# Patient Record
Sex: Female | Born: 1960 | Race: White | Hispanic: No | Marital: Single | Smoking: Current every day smoker
Health system: Southern US, Community
[De-identification: ages and names within clinical notes are randomized; demographics above are authoritative.]

## PROBLEM LIST (undated history)

## (undated) DIAGNOSIS — M549 Dorsalgia, unspecified: Secondary | ICD-10-CM

## (undated) DIAGNOSIS — F32A Depression, unspecified: Secondary | ICD-10-CM

## (undated) DIAGNOSIS — F329 Major depressive disorder, single episode, unspecified: Secondary | ICD-10-CM

## (undated) DIAGNOSIS — B1920 Unspecified viral hepatitis C without hepatic coma: Secondary | ICD-10-CM

## (undated) DIAGNOSIS — G8929 Other chronic pain: Secondary | ICD-10-CM

## (undated) DIAGNOSIS — F419 Anxiety disorder, unspecified: Secondary | ICD-10-CM

## (undated) DIAGNOSIS — M5430 Sciatica, unspecified side: Secondary | ICD-10-CM

## (undated) DIAGNOSIS — A0472 Enterocolitis due to Clostridium difficile, not specified as recurrent: Secondary | ICD-10-CM

## (undated) DIAGNOSIS — F1120 Opioid dependence, uncomplicated: Secondary | ICD-10-CM

## (undated) HISTORY — PX: CHOLECYSTECTOMY: SHX55

## (undated) HISTORY — PX: ABDOMINAL HYSTERECTOMY: SHX81

## (undated) HISTORY — PX: APPENDECTOMY: SHX54

## (undated) HISTORY — PX: LEFT OOPHORECTOMY: SHX1961

---

## 2005-10-12 ENCOUNTER — Emergency Department (HOSPITAL_COMMUNITY): Admission: EM | Admit: 2005-10-12 | Discharge: 2005-10-12 | Payer: Self-pay | Admitting: Emergency Medicine

## 2005-10-30 ENCOUNTER — Emergency Department (HOSPITAL_COMMUNITY): Admission: EM | Admit: 2005-10-30 | Discharge: 2005-10-30 | Payer: Self-pay | Admitting: Emergency Medicine

## 2005-12-05 ENCOUNTER — Emergency Department (HOSPITAL_COMMUNITY): Admission: EM | Admit: 2005-12-05 | Discharge: 2005-12-06 | Payer: Self-pay | Admitting: Emergency Medicine

## 2005-12-09 ENCOUNTER — Emergency Department (HOSPITAL_COMMUNITY): Admission: EM | Admit: 2005-12-09 | Discharge: 2005-12-09 | Payer: Self-pay | Admitting: Emergency Medicine

## 2005-12-12 ENCOUNTER — Emergency Department (HOSPITAL_COMMUNITY): Admission: EM | Admit: 2005-12-12 | Discharge: 2005-12-12 | Payer: Self-pay | Admitting: Emergency Medicine

## 2009-12-22 ENCOUNTER — Emergency Department (HOSPITAL_COMMUNITY): Admission: EM | Admit: 2009-12-22 | Discharge: 2009-12-22 | Payer: Self-pay | Admitting: Emergency Medicine

## 2011-02-07 ENCOUNTER — Inpatient Hospital Stay: Payer: Self-pay | Admitting: Psychiatry

## 2012-02-18 ENCOUNTER — Encounter (HOSPITAL_COMMUNITY): Payer: Self-pay | Admitting: *Deleted

## 2012-02-18 ENCOUNTER — Emergency Department (HOSPITAL_COMMUNITY)
Admission: EM | Admit: 2012-02-18 | Discharge: 2012-02-18 | Disposition: A | Discharge status: Home / Self Care | Payer: Self-pay | Attending: Emergency Medicine | Admitting: Emergency Medicine

## 2012-02-18 DIAGNOSIS — K0889 Other specified disorders of teeth and supporting structures: Secondary | ICD-10-CM

## 2012-02-18 DIAGNOSIS — F17200 Nicotine dependence, unspecified, uncomplicated: Secondary | ICD-10-CM | POA: Insufficient documentation

## 2012-02-18 DIAGNOSIS — K029 Dental caries, unspecified: Secondary | ICD-10-CM | POA: Insufficient documentation

## 2012-02-18 MED ORDER — HYDROCODONE-ACETAMINOPHEN 5-325 MG PO TABS: ORAL_TABLET | ORAL | Status: DC

## 2012-02-18 MED ORDER — NAPROXEN 250 MG PO TABS: 250.0000 mg | ORAL_TABLET | Freq: Two times a day (BID) | ORAL | Status: DC

## 2012-02-18 MED ORDER — OXYCODONE-ACETAMINOPHEN 5-325 MG PO TABS
1.0000 | ORAL_TABLET | Freq: Once | ORAL | Status: AC
  Administered 2012-02-18: 1 via ORAL
  Filled 2012-02-18: qty 1

## 2012-02-18 MED ORDER — PENICILLIN V POTASSIUM 250 MG PO TABS: 250.0000 mg | ORAL_TABLET | Freq: Four times a day (QID) | ORAL | Status: DC

## 2012-02-18 NOTE — ED Notes (Signed)
Pt states left upper molar pain x 3 days

## 2012-02-18 NOTE — ED Provider Notes (Signed)
History     CSN: [id number]  Arrival date & time 02/18/12  1302   First MD Initiated Contact with Patient 02/18/12 1310      Chief Complaint  Patient presents with  . Dental Pain     HPI Pt was seen at 1320.  Per pt, c/o gradual onset and persistence of constant left lower tooth "pain" for the past 3 days.  Denies fevers, no intra-oral edema, no rash, no facial swelling, no dysphagia, no neck pain.   The condition is aggravated by nothing. The condition is relieved by nothing. The symptoms have been associated with no other complaints. The patient has no significant history of serious medical conditions.    History reviewed. No pertinent past medical history.  Past Surgical History  Procedure Date  . Abdominal hysterectomy   . Appendectomy   . Left oophorectomy      History  Substance Use Topics  . Smoking status: Current Everyday Smoker    Types: Cigarettes  . Smokeless tobacco: Not on file  . Alcohol Use: No      Review of Systems ROS: Statement: All systems negative except as marked or noted in the HPI; Constitutional: Negative for fever and chills. ; ; Eyes: Negative for eye pain and discharge. ; ; ENMT: Positive for dental caries, dental hygiene poor and toothache. Negative for ear pain, bleeding gums, dental injury, facial deformity, facial swelling, hoarseness, nasal congestion, sinus pressure, sore throat, throat swelling and tongue swollen. ; ; Cardiovascular: Negative for chest pain, palpitations, diaphoresis, dyspnea and peripheral edema. ; ; Respiratory: Negative for cough, wheezing and stridor. ; ; Gastrointestinal: Negative for nausea, vomiting, diarrhea and abdominal pain. ; ; Genitourinary: Negative for dysuria, flank pain and hematuria. ; ; Musculoskeletal: Negative for back pain and neck pain. ; ; Skin: Negative for rash and skin lesion. ; ; Neuro: Negative for headache, lightheadedness and neck stiffness. ;     Allergies  Nubain  Home Medications    Current Outpatient Rx  Name Route Sig Dispense Refill  . IBUPROFEN 800 MG PO TABS Oral Take 800 mg by mouth every 8 (eight) hours as needed. For pain and swelling      BP 131/82  Pulse 78  Temp 97.5 F (36.4 C) (Oral)  Resp 20  Ht 5\' 8"  (1.727 m)  Wt 160 lb (72.576 kg)  BMI 24.33 kg/m2  SpO2 99%  Physical Exam 1325: Physical examination: Vital signs and O2 SAT: Reviewed; Constitutional: Well developed, Well nourished, Well hydrated, In no acute distress; Head and Face: Normocephalic, Atraumatic; Eyes: EOMI, PERRL, No scleral icterus; ENMT: Mouth and pharynx normal, Poor dentition, Widespread dental decay, Left TM normal, Right TM normal, Mucous membranes moist, +lower left 1st molar with extensive dental decay.  No gingival erythema, edema, fluctuance, or drainage.  No hoarse voice, no drooling, no stridor.  ; Neck: Supple, Full range of motion, No lymphadenopathy; Cardiovascular: Regular rate and rhythm, No murmur, rub, or gallop; Respiratory: Breath sounds clear & equal bilaterally, No rales, rhonchi, wheezes, or rub, Normal respiratory effort/excursion; Chest: Nontender, Movement normal; Extremities: Pulses normal, No tenderness, No edema; Neuro: AA&Ox3, Major CN grossly intact.  No gross focal motor or sensory deficits in extremities.; Skin: Color normal, No rash, No petechiae, Warm, Dry   ED Course  Procedures    MDM  MDM Reviewed: nursing note and vitals      1330:  Pt encouraged to f/u with dentist or oral surgeon for her dental needs for good continuity  of care and definitive treatment.  Verb understanding.        Laray Anger, DO 02/18/12 [phone]

## 2012-02-18 NOTE — ED Notes (Signed)
Pt c/o left upper tooth pain x 3 days.

## 2012-02-25 ENCOUNTER — Encounter (HOSPITAL_COMMUNITY): Payer: Self-pay

## 2012-02-25 ENCOUNTER — Emergency Department (HOSPITAL_COMMUNITY)
Admission: EM | Admit: 2012-02-25 | Discharge: 2012-02-25 | Disposition: A | Discharge status: Home / Self Care | Payer: Self-pay | Attending: Emergency Medicine | Admitting: Emergency Medicine

## 2012-02-25 DIAGNOSIS — K0889 Other specified disorders of teeth and supporting structures: Secondary | ICD-10-CM

## 2012-02-25 DIAGNOSIS — Z886 Allergy status to analgesic agent status: Secondary | ICD-10-CM | POA: Insufficient documentation

## 2012-02-25 DIAGNOSIS — K089 Disorder of teeth and supporting structures, unspecified: Secondary | ICD-10-CM | POA: Insufficient documentation

## 2012-02-25 DIAGNOSIS — F17200 Nicotine dependence, unspecified, uncomplicated: Secondary | ICD-10-CM | POA: Insufficient documentation

## 2012-02-25 MED ORDER — PENICILLIN V POTASSIUM 500 MG PO TABS: 500.0000 mg | ORAL_TABLET | Freq: Four times a day (QID) | ORAL | Status: AC

## 2012-02-25 MED ORDER — OXYCODONE-ACETAMINOPHEN 5-325 MG PO TABS
1.0000 | ORAL_TABLET | Freq: Once | ORAL | Status: AC
  Administered 2012-02-25: 1 via ORAL
  Filled 2012-02-25: qty 1

## 2012-02-25 MED ORDER — OXYCODONE-ACETAMINOPHEN 5-325 MG PO TABS: ORAL_TABLET | ORAL | Status: DC

## 2012-02-25 MED ORDER — PENICILLIN V POTASSIUM 250 MG PO TABS
500.0000 mg | ORAL_TABLET | Freq: Once | ORAL | Status: AC
  Administered 2012-02-25: 500 mg via ORAL
  Filled 2012-02-25: qty 2

## 2012-02-25 NOTE — ED Provider Notes (Signed)
History     CSN: [id number]  Arrival date & time 02/25/12  1208   First MD Initiated Contact with Patient 02/25/12 1236      Chief Complaint  Patient presents with  . Dental Pain    (Consider location/radiation/quality/duration/timing/severity/associated sxs/prior treatment) HPI Comments: Here last week with same complaint.  Out of penicillin and hydrocodone.  Has dental appt in 3 days.  Patient is a 51 y.o. female presenting with tooth pain. The history is provided by the patient. No language interpreter was used.  Dental PainThe primary symptoms include mouth pain. Primary symptoms do not include fever. Episode onset: ~ 10 days ago. The symptoms are unchanged. The symptoms occur constantly.  Additional symptoms do not include: purulent gums.    History reviewed. No pertinent past medical history.  Past Surgical History  Procedure Date  . Abdominal hysterectomy   . Appendectomy   . Left oophorectomy     No family history on file.  History  Substance Use Topics  . Smoking status: Current Everyday Smoker    Types: Cigarettes  . Smokeless tobacco: Not on file  . Alcohol Use: No    OB History    Grav Para Term Preterm Abortions TAB SAB Ect Mult Living                  Review of Systems  Constitutional: Negative for fever.  HENT: Positive for dental problem.   All other systems reviewed and are negative.    Allergies  Nubain  Home Medications   Current Outpatient Rx  Name Route Sig Dispense Refill  . IBUPROFEN 800 MG PO TABS Oral Take 800 mg by mouth every 8 (eight) hours as needed. For pain and swelling    . OXYCODONE-ACETAMINOPHEN 5-325 MG PO TABS  One tab po q 4-6 hrs prn pain 20 tablet 0  . PENICILLIN V POTASSIUM 500 MG PO TABS Oral Take 1 tablet (500 mg total) by mouth 4 (four) times daily. 40 tablet 0    BP 143/83  Pulse 72  Temp 98.2 F (36.8 C) (Oral)  Resp 20  SpO2 100%  Physical Exam  Nursing note and vitals reviewed. Constitutional:  She is oriented to person, place, and time. She appears well-developed and well-nourished. No distress.  HENT:  Head: Normocephalic and atraumatic.  Mouth/Throat: Uvula is midline, oropharynx is clear and moist and mucous membranes are normal. Dental caries present. No uvula swelling.    Eyes: EOM are normal.  Neck: Normal range of motion.  Cardiovascular: Normal rate, regular rhythm and normal heart sounds.   Pulmonary/Chest: Effort normal and breath sounds normal.  Abdominal: Soft. She exhibits no distension. There is no tenderness.  Musculoskeletal: Normal range of motion.  Neurological: She is alert and oriented to person, place, and time.  Skin: Skin is warm and dry.  Psychiatric: She has a normal mood and affect. Judgment normal.    ED Course  Procedures (including critical care time)  Labs Reviewed - No data to display No results found.   1. Pain, dental       MDM  Pen vk 500 mg, 40 rx-percocet, 20 F/u with dentist in 3 days as planned.        Evalina Field, [place] 02/25/12 1322

## 2012-02-25 NOTE — ED Notes (Signed)
Left side toothache for 1 week, has been seen in er for same and thinks she needs more antibiotics

## 2012-02-25 NOTE — ED Provider Notes (Signed)
Medical screening examination/treatment/procedure(s) were performed by non-physician practitioner and as supervising physician I was immediately available for consultation/collaboration.  Maribeth Jiles M Jendayi Berling, MD 02/25/12 1903 

## 2012-02-25 NOTE — ED Notes (Signed)
Pt presents with upper left tooth pain, was treated here 1 week prior. Treated with pcn and pain medication. Pt states pain continues and pain is increasing. Pt has small amount upper left swelling noted into bridge of nose. Pt states has an appointment with a dental clinic in [place] next Tuesday.

## 2012-03-18 ENCOUNTER — Encounter (HOSPITAL_COMMUNITY): Payer: Self-pay | Admitting: Emergency Medicine

## 2012-03-18 ENCOUNTER — Emergency Department (HOSPITAL_COMMUNITY)
Admission: EM | Admit: 2012-03-18 | Discharge: 2012-03-18 | Disposition: A | Discharge status: Home / Self Care | Payer: Self-pay | Attending: Emergency Medicine | Admitting: Emergency Medicine

## 2012-03-18 DIAGNOSIS — K089 Disorder of teeth and supporting structures, unspecified: Secondary | ICD-10-CM | POA: Insufficient documentation

## 2012-03-18 DIAGNOSIS — Z888 Allergy status to other drugs, medicaments and biological substances status: Secondary | ICD-10-CM | POA: Insufficient documentation

## 2012-03-18 DIAGNOSIS — K0889 Other specified disorders of teeth and supporting structures: Secondary | ICD-10-CM

## 2012-03-18 DIAGNOSIS — F17200 Nicotine dependence, unspecified, uncomplicated: Secondary | ICD-10-CM | POA: Insufficient documentation

## 2012-03-18 MED ORDER — PENICILLIN V POTASSIUM 500 MG PO TABS: 250.0000 mg | ORAL_TABLET | Freq: Four times a day (QID) | ORAL | Status: AC

## 2012-03-18 MED ORDER — IBUPROFEN 400 MG PO TABS
400.0000 mg | ORAL_TABLET | Freq: Once | ORAL | Status: AC
  Administered 2012-03-18: 800 mg via ORAL

## 2012-03-18 MED ORDER — HYDROCODONE-ACETAMINOPHEN 5-325 MG PO TABS: 1.0000 | ORAL_TABLET | ORAL | Status: AC | PRN

## 2012-03-18 MED ORDER — IBUPROFEN 800 MG PO TABS
ORAL_TABLET | ORAL | Status: AC
  Administered 2012-03-18: 800 mg via ORAL
  Filled 2012-03-18: qty 1

## 2012-03-18 NOTE — ED Provider Notes (Signed)
History     CSN: [id number]  Arrival date & time 03/18/12  1513   First MD Initiated Contact with Patient 03/18/12 1652      Chief Complaint  Patient presents with  . Dental Pain    HPI Sydney [name] is a 51 y.o. female who presents to the ED with dental pain. She had an extraction 6 days ago in the left upper. Pain started yesterday. Patient not sure if the pain is from the extraction or from the tooth next to it. She rates the pain as 8/10. The pain throbbing constant pain. She took antibiotic before the surgery but none since then. Taking ibuprofen 800 mg without relief. The history was provided by the patient.   History reviewed. No pertinent past medical history.  Past Surgical History  Procedure Date  . Abdominal hysterectomy   . Appendectomy   . Left oophorectomy     History reviewed. No pertinent family history.  History  Substance Use Topics  . Smoking status: Current Every Day Smoker    Types: Cigarettes  . Smokeless tobacco: Not on file  . Alcohol Use: No    OB History    Grav Para Term Preterm Abortions TAB SAB Ect Mult Living                  Review of Systems  Constitutional: Negative for fever, chills and activity change.  HENT: Positive for dental problem. Negative for facial swelling.   Eyes: Negative.   Respiratory: Negative.   Cardiovascular: Negative.   Gastrointestinal: Negative for nausea, vomiting, abdominal pain and diarrhea.  Skin: Negative.   Neurological: Negative for headaches.  Psychiatric/Behavioral: Negative for confusion and agitation.    Allergies  Nubain  Home Medications   Current Outpatient Rx  Name Route Sig Dispense Refill  . IBUPROFEN 800 MG PO TABS Oral Take 800 mg by mouth every 8 (eight) hours as needed. For pain and swelling    . OXYCODONE-ACETAMINOPHEN 5-325 MG PO TABS  One tab po q 4-6 hrs prn pain 20 tablet 0    BP 136/77  Pulse 96  Temp 98.1 F (36.7 C) (Oral)  Ht 5\' 8"  (1.727 m)  Wt 160 lb (72.576 kg)   BMI 24.33 kg/m2  SpO2 97%  Physical Exam  Nursing note and vitals reviewed. Constitutional: She is oriented to person, place, and time. She appears well-developed and well-nourished. No distress.       Uncomfortable appearing'  HENT:  Head: Normocephalic and atraumatic.  Mouth/Throat: Uvula is midline, oropharynx is clear and moist and mucous membranes are normal. Dental caries present. No oropharyngeal exudate.         The pain is not at the area of the extraction but the tooth next to the extraction.  Eyes: EOM are normal. Pupils are equal, round, and reactive to light.  Neck: Normal range of motion. Neck supple.  Cardiovascular: Normal rate.   Pulmonary/Chest: Effort normal.  Musculoskeletal: Normal range of motion. She exhibits no edema.  Lymphadenopathy:    She has no cervical adenopathy.  Neurological: She is alert and oriented to person, place, and time. No cranial nerve deficit.  Skin: Skin is warm and dry.  Psychiatric: She has a normal mood and affect. Her behavior is normal. Judgment and thought content normal.   Assessment: 51 y.o. female with dental pain    Dental decay  Plan:  Pain management   Antibiotics   Return to dental clinic Discussed with the patient and  all questioned fully answered. She will follow up with her dentist or return here if any problems arise.   Medication List     As of 03/18/2012  5:08 PM    START taking these medications         HYDROcodone-acetaminophen 5-325 MG per tablet   Commonly known as: NORCO/VICODIN   Take 1 tablet by mouth every 4 (four) hours as needed for pain.      penicillin v potassium 500 MG tablet   Commonly known as: VEETID   Take 0.5 tablets (250 mg total) by mouth 4 (four) times daily.      ASK your doctor about these medications         ibuprofen 800 MG tablet   Commonly known as: ADVIL,MOTRIN      oxyCODONE-acetaminophen 5-325 MG per tablet   Commonly known as: PERCOCET/ROXICET   One tab po q 4-6 hrs  prn pain          Where to get your medications    These are the prescriptions that you need to pick up.   You may get these medications from any pharmacy.         HYDROcodone-acetaminophen 5-325 MG per tablet   penicillin v potassium 500 MG tablet           ED Course  Procedures      [facility], NP 03/18/12 1708

## 2012-03-18 NOTE — ED Provider Notes (Signed)
Medical screening examination/treatment/procedure(s) were performed by non-physician practitioner and as supervising physician I was immediately available for consultation/collaboration. Devoria Albe, MD, Armando Gang   Ward Givens, MD 03/18/12 [phone]

## 2012-03-18 NOTE — ED Notes (Addendum)
Pt had tooth extraction x 5 days ago. States a lot of pain since last night. Nad. No facial swelling noted. Out of rx pain meds. Still taking ibuprofen-last dose 830

## 2012-06-19 ENCOUNTER — Emergency Department (HOSPITAL_COMMUNITY): Payer: Self-pay

## 2012-06-19 ENCOUNTER — Emergency Department (HOSPITAL_COMMUNITY)
Admission: EM | Admit: 2012-06-19 | Discharge: 2012-06-20 | Disposition: A | Discharge status: Other Acute Inpatient Hospital | Payer: Self-pay | Attending: Emergency Medicine | Admitting: Emergency Medicine

## 2012-06-19 ENCOUNTER — Encounter (HOSPITAL_COMMUNITY): Payer: Self-pay

## 2012-06-19 DIAGNOSIS — M545 Low back pain, unspecified: Secondary | ICD-10-CM | POA: Insufficient documentation

## 2012-06-19 DIAGNOSIS — F329 Major depressive disorder, single episode, unspecified: Secondary | ICD-10-CM | POA: Insufficient documentation

## 2012-06-19 DIAGNOSIS — M25569 Pain in unspecified knee: Secondary | ICD-10-CM | POA: Insufficient documentation

## 2012-06-19 DIAGNOSIS — Z79899 Other long term (current) drug therapy: Secondary | ICD-10-CM | POA: Insufficient documentation

## 2012-06-19 DIAGNOSIS — F1910 Other psychoactive substance abuse, uncomplicated: Secondary | ICD-10-CM

## 2012-06-19 DIAGNOSIS — F3289 Other specified depressive episodes: Secondary | ICD-10-CM | POA: Insufficient documentation

## 2012-06-19 DIAGNOSIS — F32A Depression, unspecified: Secondary | ICD-10-CM

## 2012-06-19 DIAGNOSIS — F39 Unspecified mood [affective] disorder: Secondary | ICD-10-CM | POA: Insufficient documentation

## 2012-06-19 DIAGNOSIS — R1032 Left lower quadrant pain: Secondary | ICD-10-CM | POA: Insufficient documentation

## 2012-06-19 DIAGNOSIS — F17200 Nicotine dependence, unspecified, uncomplicated: Secondary | ICD-10-CM | POA: Insufficient documentation

## 2012-06-19 HISTORY — DX: Depression, unspecified: F32.A

## 2012-06-19 HISTORY — DX: Major depressive disorder, single episode, unspecified: F32.9

## 2012-06-19 LAB — BASIC METABOLIC PANEL
Calcium: 9.9 mg/dL (ref 8.4–10.5)
Creatinine, Ser: 0.58 mg/dL (ref 0.50–1.10)
GFR calc Af Amer: >90 mL/min (ref >90)
Sodium: 139 mEq/L (ref 135–145)

## 2012-06-19 LAB — CBC WITH DIFFERENTIAL/PLATELET
Basophils Absolute: 0 10*3/uL (ref 0.0–0.1)
Eosinophils Absolute: 0.2 10*3/uL (ref 0.0–0.7)
Eosinophils Relative: 2 % (ref 0–5)
HCT: 47.4 % — ABNORMAL HIGH (ref 36.0–46.0)
Hemoglobin: 16.5 g/dL — ABNORMAL HIGH (ref 12.0–15.0)
Lymphs Abs: 2.6 10*3/uL (ref 0.7–4.0)
MCH: 31.8 pg (ref 26.0–34.0)
MCV: 91.3 fL (ref 78.0–100.0)
Platelets: 325 10*3/uL (ref 150–400)
RBC: 5.19 MIL/uL — ABNORMAL HIGH (ref 3.87–5.11)
RDW: 12.9 % (ref 11.5–15.5)

## 2012-06-19 LAB — ACETAMINOPHEN LEVEL: Acetaminophen (Tylenol), Serum: <15 ug/mL (ref 10–30)

## 2012-06-19 LAB — RAPID URINE DRUG SCREEN, HOSP PERFORMED
Amphetamines: NOT DETECTED
Barbiturates: NOT DETECTED
Tetrahydrocannabinol: POSITIVE — AB

## 2012-06-19 LAB — URINALYSIS, ROUTINE W REFLEX MICROSCOPIC
Glucose, UA: NEGATIVE mg/dL
Nitrite: NEGATIVE
Specific Gravity, Urine: >1.03 — ABNORMAL HIGH (ref 1.005–1.030)

## 2012-06-19 LAB — URINE MICROSCOPIC-ADD ON

## 2012-06-19 LAB — ETHANOL: Alcohol, Ethyl (B): <11 mg/dL (ref 0–11)

## 2012-06-19 MED ORDER — DULOXETINE HCL 30 MG PO CPEP
ORAL_CAPSULE | ORAL | Status: AC
  Filled 2012-06-19: qty 1

## 2012-06-19 MED ORDER — DULOXETINE HCL 30 MG PO CPEP
30.0000 mg | ORAL_CAPSULE | Freq: Every day | ORAL | Status: DC
  Administered 2012-06-19: 30 mg via ORAL
  Filled 2012-06-19 (×3): qty 1

## 2012-06-19 MED ORDER — OXYCODONE-ACETAMINOPHEN 5-325 MG PO TABS
1.0000 | ORAL_TABLET | Freq: Once | ORAL | Status: AC
  Administered 2012-06-19: 1 via ORAL
  Filled 2012-06-19: qty 1

## 2012-06-19 NOTE — ED Notes (Signed)
Pt states she has severe depression. Has been off her medication for a while and cannot afford them

## 2012-06-19 NOTE — ED Notes (Signed)
Telepsych complete

## 2012-06-19 NOTE — ED Notes (Signed)
Pt has been accepted to behavioral health, Charleen Kirks PA for Dr. Daleen Bo accepting. Room 501 bed 2.

## 2012-06-19 NOTE — ED Notes (Signed)
Transport called, [facility] does not have a truck available at this time.

## 2012-06-19 NOTE — BH Assessment (Signed)
Assessment Note   Sydney [name] is an 51 y.o. female. The patient came to the ED today because she has become increasingly depressed since running out of [place] about a month ago. 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 She has also not been able to afford her medications. She was followed in a pain management clinic in [place]. She is now in [place] and is apply for SSDI and Medicaid. She is feeling hopeless and helpless. She is not sleeping well. She has lost interest in pleasurable activities. She is having suicidal thoughts with a plant to cut herself with a box  Cutter. She cannot contract for safety. She is not homicidal and has no history ov violence. She is not psychotic. Discussed with Dr Laurice Record and feels she is in need of inpatient treatment . Patient referred to [facility] and to [facility].  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 Depression    Axis IV: economic problems, occupational problems, other psychosocial or environmental problems, problems related to social environment and problems with access to health care services Axis V: 31-40 impairment in reality testing  Past Medical History:  Past Medical History  Diagnosis Date  . Depression     Past Surgical History  Procedure Date  . Abdominal hysterectomy   . Appendectomy   . Left oophorectomy     Family History: No family history on file.  Social History:  reports that she has been smoking Cigarettes.  She does not have any smokeless tobacco history on file. She reports that she does not drink alcohol or use illicit drugs.  Additional Social History:     CIWA: CIWA-Ar BP: 118/89 mmHg Pulse Rate: 96  COWS:    Allergies:  Allergies  Allergen Reactions  . Nubain (Nalbuphine Hcl) Swelling    Home Medications:  (Not in a hospital admission)  OB/GYN Status:  No LMP recorded.  Patient has had a hysterectomy.  General Assessment Data Location of Assessment: [facility] ACT Assessment: Yes Living Arrangements: Non-relatives/Friends (roommate) Can pt return to current living arrangement?: Yes Admission Status: Voluntary Is patient capable of signing voluntary admission?: Yes Transfer from: Acute Hospital Referral Source: MD  Education Status Is patient currently in school?: No  Risk to self Suicidal Ideation: Yes-Currently Present Suicidal Intent: Yes-Currently Present Is patient at risk for suicide?: Yes Suicidal Plan?: Yes-Currently Present Specify Current Suicidal Plan: cut self with box cutte Access to Means: Yes Specify Access to Suicidal Means: has one at home What has been your use of drugs/alcohol within the last 12 months?: has been using roomamtes Methodone Previous Attempts/Gestures: No How many times?: 0  Other Self Harm Risks: no Triggers for Past Attempts: None known Intentional Self Injurious Behavior: None Family Suicide History: No Recent stressful life event(s): Job Loss;Financial Problems;Recent negative physical changes Persecutory voices/beliefs?: No Depression: Yes Depression Symptoms: Insomnia;Isolating;Fatigue;Guilt;Loss of interest in usual pleasures;Feeling worthless/self pity Substance abuse history and/or treatment for substance abuse?: Yes (was abusing pain pills now using roommates methodone) Suicide prevention information given to non-admitted patients: Not applicable  Risk to Others Homicidal Ideation: No Thoughts of Harm to Others: No Current Homicidal Intent: No Current Homicidal Plan: No Access to Homicidal Means: No History of harm to others?: No Assessment of Violence: None Noted Does patient have access to weapons?: No Criminal Charges Pending?: No Does patient have  a court date: No  Psychosis Hallucinations: None noted Delusions: None noted  Mental Status Report Appear/Hygiene: Improved Eye Contact:  Good Motor Activity: Restlessness;Freedom of movement Speech: Rapid;Logical/coherent Level of Consciousness: Alert;Restless;Irritable Mood: Depressed;Helpless;Irritable Affect: Anxious;Depressed;Irritable Anxiety Level: Moderate Thought Processes: Coherent;Relevant Judgement: Unimpaired Orientation: Person;Time;Place Obsessive Compulsive Thoughts/Behaviors: None  Cognitive Functioning Concentration: Decreased Memory: Recent Intact;Remote Intact IQ: Average Insight: Fair Impulse Control: Poor Weight Loss: 0  Weight Gain: 0  Sleep: Decreased Total Hours of Sleep: 4  (sleeps mostly in day time) Vegetative Symptoms: Not bathing;Decreased grooming  ADLScreening [facility] Assessment Services) Patient's cognitive ability adequate to safely complete daily activities?: Yes Patient able to express need for assistance with ADLs?: Yes Independently performs ADLs?: Yes (appropriate for developmental age)  Abuse/Neglect [facility]) Physical Abuse: Denies Verbal Abuse: Denies Sexual Abuse: Denies  Prior Inpatient Therapy Prior Inpatient Therapy: No  Prior Outpatient Therapy Prior Outpatient Therapy: No  ADL Screening (condition at time of admission) Patient's cognitive ability adequate to safely complete daily activities?: Yes Patient able to express need for assistance with ADLs?: Yes Independently performs ADLs?: Yes (appropriate for developmental age)       Abuse/Neglect Assessment (Assessment to be complete while patient is alone) Physical Abuse: Denies Verbal Abuse: Denies Sexual Abuse: Denies Values / Beliefs Cultural Requests During Hospitalization: None Spiritual Requests During Hospitalization: None        Additional Information 1:1 In Past 12 Months?: No CIRT Risk: No Elopement Risk: No Does patient have medical clearance?: Yes     Disposition:  Disposition Disposition of Patient: Inpatient treatment program Type of inpatient treatment program: Adult  On Site  Evaluation by:   Reviewed with Physician:     Jearld Pies 06/19/2012 3:28 PM

## 2012-06-19 NOTE — ED Provider Notes (Addendum)
History   This chart was scribed for Carleene Cooper III, MD by Sofie Rower, ED Scribe. The patient was seen in room APA15/APA15 and the patient's care was started at 1:39PM.    CSN: [id number]  Arrival date & time 06/19/12  1257   First MD Initiated Contact with Patient 06/19/12 1339      Chief Complaint  Patient presents with  . V70.1    (Consider location/radiation/quality/duration/timing/severity/associated sxs/prior treatment) The history is provided by the patient. No language interpreter was used.    Sydney [name] is a 51 y.o. female , with a hx of depression and dental pain, who presents to the Emergency Department complaining of gradual, progressively worsening, depression, onset one week ago. Associated symptoms include back pain located at the lumbar region and knee pain located at the right knee. The pt reports she is extremely depressed and cannot afford her regularly scheduled Cymbalta medication. The pt informs she had been on disability for a previous back injury and receiving pain management for the past 4 years. She has been unable to obtain a Physician at this time and  forced to take her roommates Methadone. The pt's last application of methadone was one week ago. Modifying factors include certain movements and positions of the back which intensifies the back pain. The pt has an additional hx of appendectomy and left oophorectomy.  The pt is a current everyday smoker, in addition to occasionally drinking alcohol.     Past Medical History  Diagnosis Date  . Depression     Past Surgical History  Procedure Date  . Abdominal hysterectomy   . Appendectomy   . Left oophorectomy     No family history on file.  History  Substance Use Topics  . Smoking status: Current Every Day Smoker    Types: Cigarettes  . Smokeless tobacco: Not on file  . Alcohol Use: No    OB History    Grav Para Term Preterm Abortions TAB SAB Ect Mult Living                  Review of  Systems  Gastrointestinal: Positive for abdominal pain.  Musculoskeletal: Positive for back pain and arthralgias.  Psychiatric/Behavioral: Positive for dysphoric mood.  All other systems reviewed and are negative.    Allergies  Nubain  Home Medications   Current Outpatient Rx  Name  Route  Sig  Dispense  Refill  . IBUPROFEN 800 MG PO TABS   Oral   Take 800 mg by mouth every 8 (eight) hours as needed. For pain and swelling         . METHADONE HCL 10 MG PO TABS   Oral   Take 10 mg by mouth 3 (three) times daily.         . SERTRALINE HCL 50 MG PO TABS   Oral   Take 50 mg by mouth daily.           BP 118/89  Pulse 96  Temp 98 F (36.7 C) (Oral)  Resp 20  Ht 5\' 8"  (1.727 m)  Wt 170 lb (77.111 kg)  BMI 25.85 kg/m2  SpO2 99%  Physical Exam  Nursing note and vitals reviewed. Constitutional: She is oriented to person, place, and time. She appears well-developed and well-nourished. No distress.  HENT:  Head: Normocephalic and atraumatic.  Mouth/Throat: Oropharynx is clear and moist.       Poor Dentition detected.   Eyes: Conjunctivae normal and EOM are normal. Pupils are  equal, round, and reactive to light.  Neck: Normal range of motion. Neck supple. No tracheal deviation present.  Cardiovascular: Normal rate, regular rhythm and normal heart sounds.   Pulmonary/Chest: Effort normal. No respiratory distress.  Abdominal: Soft. She exhibits no distension. There is tenderness.       Abdominal tenderness detected at the LLQ.  Genitourinary:       Normal female external genitalia.  Has white discharge.  Mild left adnexal tenderness,no mass.  Uterus and right ovary known to be surgically absent.  Musculoskeletal: Normal range of motion. She exhibits no edema and no tenderness.       Right knee: tenderness found.       Lumbar back: She exhibits tenderness.  Neurological: She is alert and oriented to person, place, and time. No sensory deficit.  Skin: Skin is warm and  dry.  Psychiatric: Her behavior is normal. She exhibits a depressed mood.    ED Course  Procedures (including critical care time)  DIAGNOSTIC STUDIES: Oxygen Saturation is 99% on room air, normal by my interpretation.    COORDINATION OF CARE:  2:00 PM- Treatment plan concerning behavioral health evaluation discussed with patient. Pt agrees with treatment.    Results for orders placed during the hospital encounter of 06/19/12  BASIC METABOLIC PANEL      Component Value Range   Sodium 139  135 - 145 mEq/L   Potassium 3.5  3.5 - 5.1 mEq/L   Chloride 101  96 - 112 mEq/L   CO2 27  19 - 32 mEq/L   Glucose, Bld 74  70 - 99 mg/dL   BUN 13  6 - 23 mg/dL   Creatinine, Ser 9.56  0.50 - 1.10 mg/dL   Calcium 9.9  8.4 - 21.3 mg/dL   GFR calc non Af Amer >90  >90 mL/min   GFR calc Af Amer >90  >90 mL/min  CBC WITH DIFFERENTIAL      Component Value Range   WBC 10.2  4.0 - 10.5 K/uL   RBC 5.19 (*) 3.87 - 5.11 MIL/uL   Hemoglobin 16.5 (*) 12.0 - 15.0 g/dL   HCT 08.6 (*) 57.8 - 46.9 %   MCV 91.3  78.0 - 100.0 fL   MCH 31.8  26.0 - 34.0 pg   MCHC 34.8  30.0 - 36.0 g/dL   RDW 62.9  52.8 - 41.3 %   Platelets 325  150 - 400 K/uL   Neutrophils Relative 65  43 - 77 %   Neutro Abs 6.6  1.7 - 7.7 K/uL   Lymphocytes Relative 26  12 - 46 %   Lymphs Abs 2.6  0.7 - 4.0 K/uL   Monocytes Relative 8  3 - 12 %   Monocytes Absolute 0.8  0.1 - 1.0 K/uL   Eosinophils Relative 2  0 - 5 %   Eosinophils Absolute 0.2  0.0 - 0.7 K/uL   Basophils Relative 0  0 - 1 %   Basophils Absolute 0.0  0.0 - 0.1 K/uL  URINALYSIS, ROUTINE W REFLEX MICROSCOPIC      Component Value Range   Color, Urine YELLOW  YELLOW   APPearance CLEAR  CLEAR   Specific Gravity, Urine >1.030 (*) 1.005 - 1.030   pH 5.5  5.0 - 8.0   Glucose, UA NEGATIVE  NEGATIVE mg/dL   Hgb urine dipstick SMALL (*) NEGATIVE   Bilirubin Urine SMALL (*) NEGATIVE   Ketones, ur NEGATIVE  NEGATIVE mg/dL   Protein, ur NEGATIVE  NEGATIVE mg/dL    Urobilinogen, UA 0.2  0.0 - 1.0 mg/dL   Nitrite NEGATIVE  NEGATIVE   Leukocytes, UA SMALL (*) NEGATIVE  URINE RAPID DRUG SCREEN (HOSP PERFORMED)      Component Value Range   Opiates NONE DETECTED  NONE DETECTED   Cocaine NONE DETECTED  NONE DETECTED   Benzodiazepines POSITIVE (*) NONE DETECTED   Amphetamines NONE DETECTED  NONE DETECTED   Tetrahydrocannabinol POSITIVE (*) NONE DETECTED   Barbiturates NONE DETECTED  NONE DETECTED  ETHANOL      Component Value Range   Alcohol, Ethyl (B) <11  0 - 11 mg/dL  URINE MICROSCOPIC-ADD ON      Component Value Range   WBC, UA 0-2  <3 WBC/hpf   RBC / HPF 0-2  <3 RBC/hpf   Bacteria, UA RARE  RARE   Urine-Other TRICHOMONAS PRESENT    ACETAMINOPHEN LEVEL      Component Value Range   Acetaminophen (Tylenol), Serum <15.0  10 - 30 ug/mL  SALICYLATE LEVEL      Component Value Range   Salicylate Lvl <2.0 (*) 2.8 - 20.0 mg/dL   Dg Chest 2 View  56/21/3086  *RADIOLOGY REPORT*  Clinical Data: Chest pain.  CHEST - 2 VIEW  Comparison: None.  Findings: Large lung volumes and smoking history suggests emphysema.  Cardiac and mediastinal contours appear unremarkable.  The lungs appear clear.  IMPRESSION:  1.  Suspected emphysema.   Otherwise, no significant abnormality identified.   Original Report Authenticated By: Gaylyn Rong, M.D.    Dg Lumbar Spine Complete  06/19/2012  *RADIOLOGY REPORT*  Clinical Data: Low back pain.  LUMBAR SPINE - COMPLETE 4+ VIEW  Comparison: 10/12/2005  Findings: Endplate and adjacent Schmorl's nodes noted anteriorly at the T12-L1 level, favoring disc degeneration.  There is also loss of disc height and associated spurring at L5 S1.  No fracture or malalignment observed.  IMPRESSION:  1.  Evidence of spondylosis and degenerative disc disease at T12-L1 and L5-S1.  If symptoms persist despite conservative therapy, MRI followup may be warranted.   Original Report Authenticated By: Gaylyn Rong, M.D.    US Transvaginal  Non-ob  06/19/2012  *RADIOLOGY REPORT*  Clinical Data: Left lower quadrant pain question ovarian cyst  TRANSABDOMINAL AND TRANSVAGINAL ULTRASOUND OF PELVIS Technique:  Both transabdominal and transvaginal ultrasound examinations of the pelvis were performed. Transabdominal technique was performed for global imaging of the pelvis including uterus, ovaries, adnexal regions, and pelvic cul-de-sac.  It was necessary to proceed with endovaginal exam following the transabdominal exam to visualize the right adnexa.  Comparison:  None  Findings:  Uterus: Surgically absent  Endometrium: N/A  Right ovary:  Surgically absent per patient  Left ovary: Left ovary is not identified on either transabdominal or endovaginal imaging, question related to obscuration by bowel, atrophy or surgical absence.  Correlation with surgical history recommended.  Other findings: No free fluid or pelvic masses identified.  IMPRESSION: Surgical absence of uterus and right ovary. Nonvisualization of left ovary as above. No sonographic abnormalities identified.   Original Report Authenticated By: Ulyses Southward, M.D.    US Pelvis Complete  06/19/2012  *RADIOLOGY REPORT*  Clinical Data: Left lower quadrant pain question ovarian cyst  TRANSABDOMINAL AND TRANSVAGINAL ULTRASOUND OF PELVIS Technique:  Both transabdominal and transvaginal ultrasound examinations of the pelvis were performed. Transabdominal technique was performed for global imaging of the pelvis including uterus, ovaries, adnexal regions, and pelvic cul-de-sac.  It was necessary to proceed with endovaginal exam following  the transabdominal exam to visualize the right adnexa.  Comparison:  None  Findings:  Uterus: Surgically absent  Endometrium: N/A  Right ovary:  Surgically absent per patient  Left ovary: Left ovary is not identified on either transabdominal or endovaginal imaging, question related to obscuration by bowel, atrophy or surgical absence.  Correlation with surgical history  recommended.  Other findings: No free fluid or pelvic masses identified.  IMPRESSION: Surgical absence of uterus and right ovary. Nonvisualization of left ovary as above. No sonographic abnormalities identified.   Original Report Authenticated By: Ulyses Southward, M.D.     Lab workup was essentially negative.  Pt was seen by [facility], and efforts are being made to have her admitted to a psychiatric hospital for treatment of depression and substance abuse.  8:31 PM Pt had telepsychiatry consultation.  Dr. Henderson Cloud recommended Cymbalta 30 mg po qd for her depression.  He recommended inpatient psychiatric care.  9:21 PM Pt has been accepted for transfer to [facility] by Dr. Daleen Bo.   1. Depression   2. Substance abuse        Carleene Cooper III, MD 06/19/12 2031    Carleene Cooper III, MD 06/19/12 2121

## 2012-06-20 ENCOUNTER — Inpatient Hospital Stay (HOSPITAL_COMMUNITY)
Admission: AD | Admit: 2012-06-20 | Discharge: 2012-06-25 | DRG: 897 | Disposition: A | Discharge status: Home / Self Care | Payer: Medicaid Other | Source: Ambulatory Visit | Attending: Psychiatry | Admitting: Psychiatry

## 2012-06-20 DIAGNOSIS — F17200 Nicotine dependence, unspecified, uncomplicated: Secondary | ICD-10-CM | POA: Diagnosis present

## 2012-06-20 DIAGNOSIS — F1120 Opioid dependence, uncomplicated: Principal | ICD-10-CM | POA: Diagnosis present

## 2012-06-20 DIAGNOSIS — F329 Major depressive disorder, single episode, unspecified: Secondary | ICD-10-CM | POA: Diagnosis present

## 2012-06-20 DIAGNOSIS — K089 Disorder of teeth and supporting structures, unspecified: Secondary | ICD-10-CM | POA: Diagnosis present

## 2012-06-20 DIAGNOSIS — F1994 Other psychoactive substance use, unspecified with psychoactive substance-induced mood disorder: Secondary | ICD-10-CM | POA: Diagnosis present

## 2012-06-20 DIAGNOSIS — F3289 Other specified depressive episodes: Secondary | ICD-10-CM | POA: Diagnosis present

## 2012-06-20 DIAGNOSIS — F419 Anxiety disorder, unspecified: Secondary | ICD-10-CM | POA: Diagnosis present

## 2012-06-20 DIAGNOSIS — F411 Generalized anxiety disorder: Secondary | ICD-10-CM | POA: Diagnosis present

## 2012-06-20 DIAGNOSIS — G4700 Insomnia, unspecified: Secondary | ICD-10-CM | POA: Diagnosis present

## 2012-06-20 DIAGNOSIS — K0889 Other specified disorders of teeth and supporting structures: Secondary | ICD-10-CM

## 2012-06-20 LAB — HEPATIC FUNCTION PANEL
ALT: 47 U/L — ABNORMAL HIGH (ref 0–35)
Bilirubin, Direct: 0.1 mg/dL (ref 0.0–0.3)
Indirect Bilirubin: 0.1 mg/dL — ABNORMAL LOW (ref 0.3–0.9)

## 2012-06-20 MED ORDER — DULOXETINE HCL 30 MG PO CPEP
30.0000 mg | ORAL_CAPSULE | Freq: Every day | ORAL | Status: DC
  Administered 2012-06-20 – 2012-06-25 (×6): 30 mg via ORAL
  Filled 2012-06-20 (×9): qty 1

## 2012-06-20 MED ORDER — DICYCLOMINE HCL 20 MG PO TABS
20.0000 mg | ORAL_TABLET | Freq: Four times a day (QID) | ORAL | Status: AC | PRN
  Administered 2012-06-20: 20 mg via ORAL
  Filled 2012-06-20: qty 1

## 2012-06-20 MED ORDER — TRAZODONE HCL 50 MG PO TABS
50.0000 mg | ORAL_TABLET | Freq: Every evening | ORAL | Status: DC | PRN
  Administered 2012-06-20 – 2012-06-21 (×4): 50 mg via ORAL
  Filled 2012-06-20 (×9): qty 1

## 2012-06-20 MED ORDER — METOCLOPRAMIDE HCL 10 MG PO TABS: 5.0000 mg | ORAL_TABLET | Freq: Four times a day (QID) | ORAL | Status: DC | PRN

## 2012-06-20 MED ORDER — NICOTINE 21 MG/24HR TD PT24
21.0000 mg | MEDICATED_PATCH | Freq: Every day | TRANSDERMAL | Status: DC
  Administered 2012-06-20 – 2012-06-25 (×6): 21 mg via TRANSDERMAL
  Filled 2012-06-20 (×9): qty 1

## 2012-06-20 MED ORDER — IBUPROFEN 800 MG PO TABS
800.0000 mg | ORAL_TABLET | Freq: Three times a day (TID) | ORAL | Status: DC
  Filled 2012-06-20 (×3): qty 1

## 2012-06-20 MED ORDER — MAGNESIUM HYDROXIDE 400 MG/5ML PO SUSP: 30.0000 mL | Freq: Every day | ORAL | Status: DC | PRN

## 2012-06-20 MED ORDER — CLONIDINE HCL 0.1 MG PO TABS
0.1000 mg | ORAL_TABLET | ORAL | Status: AC
  Administered 2012-06-22 – 2012-06-23 (×3): 0.1 mg via ORAL
  Filled 2012-06-20 (×4): qty 1

## 2012-06-20 MED ORDER — IBUPROFEN 800 MG PO TABS
800.0000 mg | ORAL_TABLET | Freq: Three times a day (TID) | ORAL | Status: DC | PRN
  Administered 2012-06-21 – 2012-06-25 (×9): 800 mg via ORAL
  Filled 2012-06-20 (×9): qty 1

## 2012-06-20 MED ORDER — HYDROXYZINE HCL 50 MG PO TABS
50.0000 mg | ORAL_TABLET | Freq: Every evening | ORAL | Status: DC | PRN
  Filled 2012-06-20 (×3): qty 1

## 2012-06-20 MED ORDER — GABAPENTIN 300 MG PO CAPS
300.0000 mg | ORAL_CAPSULE | Freq: Every day | ORAL | Status: DC
  Administered 2012-06-20 – 2012-06-24 (×5): 300 mg via ORAL
  Filled 2012-06-20 (×8): qty 1

## 2012-06-20 MED ORDER — CLONIDINE HCL 0.1 MG PO TABS
0.1000 mg | ORAL_TABLET | Freq: Every day | ORAL | Status: AC
  Administered 2012-06-23 – 2012-06-25 (×3): 0.1 mg via ORAL
  Filled 2012-06-20 (×2): qty 1

## 2012-06-20 MED ORDER — GABAPENTIN 300 MG PO CAPS
300.0000 mg | ORAL_CAPSULE | Freq: Three times a day (TID) | ORAL | Status: DC
  Administered 2012-06-20 – 2012-06-25 (×15): 300 mg via ORAL
  Filled 2012-06-20 (×24): qty 1

## 2012-06-20 MED ORDER — CLONIDINE HCL 0.1 MG PO TABS
0.1000 mg | ORAL_TABLET | Freq: Four times a day (QID) | ORAL | Status: AC
  Administered 2012-06-20 – 2012-06-21 (×4): 0.1 mg via ORAL
  Filled 2012-06-20 (×9): qty 1

## 2012-06-20 MED ORDER — SERTRALINE HCL 50 MG PO TABS: 50.0000 mg | ORAL_TABLET | Freq: Every day | ORAL | Status: DC

## 2012-06-20 MED ORDER — SERTRALINE HCL 25 MG PO TABS
25.0000 mg | ORAL_TABLET | Freq: Every day | ORAL | Status: DC
  Filled 2012-06-20 (×2): qty 1

## 2012-06-20 MED ORDER — METHOCARBAMOL 500 MG PO TABS
500.0000 mg | ORAL_TABLET | Freq: Three times a day (TID) | ORAL | Status: AC | PRN
  Administered 2012-06-20 – 2012-06-24 (×5): 500 mg via ORAL
  Filled 2012-06-20 (×5): qty 1
  Filled 2012-06-20: qty 20

## 2012-06-20 MED ORDER — LOPERAMIDE HCL 2 MG PO CAPS: 2.0000 mg | ORAL_CAPSULE | ORAL | Status: AC | PRN

## 2012-06-20 MED ORDER — HYDROXYZINE HCL 25 MG PO TABS
25.0000 mg | ORAL_TABLET | ORAL | Status: DC | PRN
  Administered 2012-06-20 – 2012-06-24 (×11): 25 mg via ORAL
  Filled 2012-06-20: qty 1
  Filled 2012-06-20: qty 40
  Filled 2012-06-20: qty 1

## 2012-06-20 MED ORDER — NAPROXEN 500 MG PO TABS
500.0000 mg | ORAL_TABLET | Freq: Two times a day (BID) | ORAL | Status: DC | PRN
  Administered 2012-06-20: 500 mg via ORAL
  Filled 2012-06-20: qty 1

## 2012-06-20 MED ORDER — ACETAMINOPHEN 325 MG PO TABS
650.0000 mg | ORAL_TABLET | Freq: Four times a day (QID) | ORAL | Status: DC | PRN
  Administered 2012-06-22: 650 mg via ORAL

## 2012-06-20 NOTE — Progress Notes (Signed)
Pt reports she has been feeling better since starting the Neurontin earlier today.  She says it has been helpful.  She denies SI/HI/AV at this time.  She was encouraged to make her needs known to staff.  She voices no needs at this time.  She is on the Clonidine protocol for opiate withdrawal.  She is unsure what she will do at discharge from [facility].  Safety maintained with q15 minute checks.

## 2012-06-20 NOTE — Progress Notes (Signed)
Psychoeducational Group Note  Date:  06/20/2012 Time:  2015  Group Topic/Focus:  Wrap-Up Group:   The focus of this group is to help patients review their daily goal of treatment and discuss progress on daily workbooks.  Participation Level:  Did Not Attend  Participation Quality:  N/A Did not attend  Affect:    Cognitive:    Insight:    Engagement in Group:    Additional Comments:  Did not attend. Patient was in bed, awake and asleep, for the duration of of wrap-up group this evening.   Marcellina Jonsson, Newton Pigg 06/20/2012, 10:21 PM

## 2012-06-20 NOTE — Progress Notes (Signed)
Patient ID: Sydney [name], female   DOB: Jun 17, 1961, 51 y.o.   MRN: [id number] This is a 51 year old Caucasian female admitted to the unit for depression. She reported that she was depressed because of her inability to afford her pain and anti-depressant medications. She stated that she stopped taking her Cymbalta about a month ago and Methadone a week ago. Patient reported  she moved from [place] to this area, had no doctor since she got here and and has been sharing methadone with her room mate, but her room mate told her that her prescription has been changed to another thing and she had not taken any in one week. Patient's mood and affect sad and depressed, appeared to be having some withdrawal symptoms evidenced subjective and objective assessment. She endorsed nausea, tremors, chills, stomach cramps and pain. Patient endorsed past history of suicide attempt, some suicidal thoughts, but verbally contracted for safety. She also endorsed using THC to self medicate for back pain and occasional alcohol use, last use a week ago. She denied a/v hallucinations. Patient oriented to the unit; writer consulted with PA and he placed patient on Clonidine protocol.

## 2012-06-20 NOTE — H&P (Signed)
Psychiatric Admission Assessment Adult  Patient Identification:  Sydney [name] Date of Evaluation:  06/20/2012 Chief Complaint:  Mood Disorder NOS History of Present Illness:: Depression symptoms began about a month ago when she ran out of her Cymbalta, depression increased and she became suicidal, continuous symptoms that increase with pain in her back and leg, 10/10 on depression scale with passive suicidal thoughts, contracts for safety.  She also could not afford to see her MD for more pain medications and was taking her room-mates methadone until last week. Elements:  Location:  Generalized. Quality:  Severe. Severity:  10/10. Timing:  Constant. Duration:  one month. Context:  Increases with pain. Associated Signs/Synptoms: Depression Symptoms:  depressed mood, fatigue, feelings of worthlessness/guilt, hopelessness, suicidal thoughts without plan, anxiety, loss of energy/fatigue, disturbed sleep, (Hypo) Manic Symptoms:  Denies Anxiety Symptoms:  Excessive Worry, Psychotic Symptoms:  Denies PTSD Symptoms:  None  Psychiatric Specialty Exam: Physical Exam  Review of Systems  Constitutional: Negative.   Eyes: Negative.   Respiratory: Negative.   Cardiovascular: Negative.   Gastrointestinal: Negative.   Genitourinary: Negative.   Musculoskeletal: Positive for back pain.       Siatic nerve pain in her leg  Skin: Negative.   Neurological: Positive for tremors.  Endo/Heme/Allergies: Negative.   Psychiatric/Behavioral: Positive for depression, suicidal ideas and substance abuse. The patient is nervous/anxious.     Blood pressure 134/84, pulse 72, temperature 97.8 F (36.6 C), temperature source Oral, resp. rate 16, height 5\' 8"  (1.727 m), weight 70.761 kg (156 lb).Body mass index is 23.72 kg/(m^2).  General Appearance: Casual  Eye Contact::  Fair  Speech:  Normal Rate  Volume:  Decreased  Mood:  Depressed  Affect:  Depressed  Thought Process:  Coherent  Orientation:   Full (Time, Place, and Person)  Thought Content:  WDL  Suicidal Thoughts:  Yes.  without intent/plan  Homicidal Thoughts:  No  Memory:  Immediate;   Fair Recent;   Fair Remote;   Fair  Judgement:  Impaired  Insight:  Fair  Psychomotor Activity:  Decreased  Concentration:  Fair  Recall:  Fair  Akathisia:  No  Handed:  Right  AIMS (if indicated):     Assets:  Housing Social Support  Sleep:  Number of Hours: 2.25     Past Psychiatric History: Diagnosis:  Depression  Hospitalizations:    Outpatient Care:  [place]  Substance Abuse Care:  Self-Mutilation:  None  Suicidal Attempts:  Violent Behaviors:  None   Past Medical History:   Past Medical History  Diagnosis Date  . Depression    None. Allergies:   Allergies  Allergen Reactions  . Nubain (Nalbuphine Hcl) Swelling   PTA Medications: Prescriptions prior to admission  Medication Sig Dispense Refill  . ibuprofen (ADVIL,MOTRIN) 800 MG tablet Take 800 mg by mouth every 8 (eight) hours as needed. For pain and swelling      . methadone (DOLOPHINE) 10 MG tablet Take 10 mg by mouth 3 (three) times daily.      . sertraline (ZOLOFT) 50 MG tablet Take 50 mg by mouth daily.        Previous Psychotropic Medications:  Medication/Dose  See MAR               Substance Abuse History in the last 12 months:  yes  Consequences of Substance Abuse: Withdrawal Symptoms:   Tremors  Social History:  reports that she has been smoking Cigarettes.  She does not have any smokeless tobacco history on  file. She reports that she does not drink alcohol or use illicit drugs. Additional Social History:  Current Place of Residence:   Place of Birth:   Family Members: Marital Status:  Single Children:  Sons:  Daughters:  2 Relationships:  None Education:  GED Educational Problems/Performance:  None Religious Beliefs/Practices: History of Abuse (Emotional/Phsycial/Sexual) Teacher, music History:   None. Legal History: Hobbies/Interests:  Family History:  No family history on file.  Results for orders placed during the hospital encounter of 06/19/12 (from the past 72 hour(s))  URINALYSIS, ROUTINE W REFLEX MICROSCOPIC     Status: Abnormal   Collection Time   06/19/12 12:45 PM      Component Value Range Comment   Color, Urine YELLOW  YELLOW    APPearance CLEAR  CLEAR    Specific Gravity, Urine >1.030 (*) 1.005 - 1.030    pH 5.5  5.0 - 8.0    Glucose, UA NEGATIVE  NEGATIVE mg/dL    Hgb urine dipstick SMALL (*) NEGATIVE    Bilirubin Urine SMALL (*) NEGATIVE    Ketones, ur NEGATIVE  NEGATIVE mg/dL    Protein, ur NEGATIVE  NEGATIVE mg/dL    Urobilinogen, UA 0.2  0.0 - 1.0 mg/dL    Nitrite NEGATIVE  NEGATIVE    Leukocytes, UA SMALL (*) NEGATIVE   URINE RAPID DRUG SCREEN (HOSP PERFORMED)     Status: Abnormal   Collection Time   06/19/12 12:45 PM      Component Value Range Comment   Opiates NONE DETECTED  NONE DETECTED    Cocaine NONE DETECTED  NONE DETECTED    Benzodiazepines POSITIVE (*) NONE DETECTED    Amphetamines NONE DETECTED  NONE DETECTED    Tetrahydrocannabinol POSITIVE (*) NONE DETECTED    Barbiturates NONE DETECTED  NONE DETECTED   URINE MICROSCOPIC-ADD ON     Status: Normal   Collection Time   06/19/12 12:45 PM      Component Value Range Comment   WBC, UA 0-2  <3 WBC/hpf    RBC / HPF 0-2  <3 RBC/hpf    Bacteria, UA RARE  RARE    Urine-Other TRICHOMONAS PRESENT     BASIC METABOLIC PANEL     Status: Normal   Collection Time   06/19/12  1:19 PM      Component Value Range Comment   Sodium 139  135 - 145 mEq/L    Potassium 3.5  3.5 - 5.1 mEq/L    Chloride 101  96 - 112 mEq/L    CO2 27  19 - 32 mEq/L    Glucose, Bld 74  70 - 99 mg/dL    BUN 13  6 - 23 mg/dL    Creatinine, Ser 0.45  0.50 - 1.10 mg/dL    Calcium 9.9  8.4 - 40.9 mg/dL    GFR calc non Af Amer >90  >90 mL/min    GFR calc Af Amer >90  >90 mL/min   CBC WITH DIFFERENTIAL     Status: Abnormal    Collection Time   06/19/12  1:19 PM      Component Value Range Comment   WBC 10.2  4.0 - 10.5 K/uL    RBC 5.19 (*) 3.87 - 5.11 MIL/uL    Hemoglobin 16.5 (*) 12.0 - 15.0 g/dL    HCT 81.1 (*) 91.4 - 46.0 %    MCV 91.3  78.0 - 100.0 fL    MCH 31.8  26.0 - 34.0 pg    MCHC  34.8  30.0 - 36.0 g/dL    RDW 78.4  69.6 - 29.5 %    Platelets 325  150 - 400 K/uL    Neutrophils Relative 65  43 - 77 %    Neutro Abs 6.6  1.7 - 7.7 K/uL    Lymphocytes Relative 26  12 - 46 %    Lymphs Abs 2.6  0.7 - 4.0 K/uL    Monocytes Relative 8  3 - 12 %    Monocytes Absolute 0.8  0.1 - 1.0 K/uL    Eosinophils Relative 2  0 - 5 %    Eosinophils Absolute 0.2  0.0 - 0.7 K/uL    Basophils Relative 0  0 - 1 %    Basophils Absolute 0.0  0.0 - 0.1 K/uL   ETHANOL     Status: Normal   Collection Time   06/19/12  1:19 PM      Component Value Range Comment   Alcohol, Ethyl (B) <11  0 - 11 mg/dL   ACETAMINOPHEN LEVEL     Status: Normal   Collection Time   06/19/12  1:19 PM      Component Value Range Comment   Acetaminophen (Tylenol), Serum <15.0  10 - 30 ug/mL   SALICYLATE LEVEL     Status: Abnormal   Collection Time   06/19/12  1:19 PM      Component Value Range Comment   Salicylate Lvl <2.0 (*) 2.8 - 20.0 mg/dL    Psychological Evaluations:  Assessment:   AXIS I:  Depressive Disorder NOS, General Anxiety Disorder, Opiate dependency AXIS II:  Deferred AXIS III:   Past Medical History  Diagnosis Date  . Depression    AXIS IV:  other psychosocial or environmental problems, problems related to social environment, problems with access to health care services and problems with primary support group AXIS V:  41-50 serious symptoms  Treatment Plan/Recommendations:   1-Crisis stabilization and management 2-Medication management for depression  3-Opiate management 4-Individual and group therapy 5-Discharge plan to prevent depression relapse 6-Coping skill development for depression and pain  management  Treatment Plan Summary: Daily contact with patient to assess and evaluate symptoms and progress in treatment Medication management Current Medications:  Current Facility-Administered Medications  Medication Dose Route Frequency Provider Last Rate Last Dose  . acetaminophen (TYLENOL) tablet 650 mg  650 mg Oral Q6H PRN Kerry Hough, PA      . cloNIDine (CATAPRES) tablet 0.1 mg  0.1 mg Oral QID Kerry Hough, PA   0.1 mg at 06/20/12 2841   Followed by  . cloNIDine (CATAPRES) tablet 0.1 mg  0.1 mg Oral BH-qamhs Kerry Hough, PA       Followed by  . cloNIDine (CATAPRES) tablet 0.1 mg  0.1 mg Oral QAC breakfast Kerry Hough, PA      . dicyclomine (BENTYL) tablet 20 mg  20 mg Oral Q6H PRN Kerry Hough, PA   20 mg at 06/20/12 0252  . hydrOXYzine (ATARAX/VISTARIL) tablet 50 mg  50 mg Oral QHS,MR X 1 Kerry Hough, PA      . loperamide (IMODIUM) capsule 2-4 mg  2-4 mg Oral PRN Kerry Hough, PA      . magnesium hydroxide (MILK OF MAGNESIA) suspension 30 mL  30 mL Oral Daily PRN Kerry Hough, PA      . methocarbamol (ROBAXIN) tablet 500 mg  500 mg Oral Q8H PRN Kerry Hough, PA   500 mg  at 06/20/12 0252  . metoCLOPramide (REGLAN) tablet 5 mg  5 mg Oral Q6H PRN Kerry Hough, PA      . naproxen (NAPROSYN) tablet 500 mg  500 mg Oral BID PRN Kerry Hough, PA   500 mg at 06/20/12 0852  . nicotine (NICODERM CQ - dosed in mg/24 hours) patch 21 mg  21 mg Transdermal Q0600 Kerry Hough, PA   21 mg at 06/20/12 0600  . sertraline (ZOLOFT) tablet 25 mg  25 mg Oral Daily Kerry Hough, PA      . traZODone (DESYREL) tablet 50 mg  50 mg Oral QHS,MR X 1 Kerry Hough, PA        Observation Level/Precautions:  15 minute checks  Laboratory:  Completed and reviewed, stable  Psychotherapy:  Individual and group therapy  Medications:  See MAR  Consultations:  None  Discharge Concerns:  None  Estimated LOS:  5-7 days  Other:     I certify that inpatient services  furnished can reasonably be expected to improve the patient's condition.   Nanine Means, PMH-NP 12/18/20139:54 AM

## 2012-06-20 NOTE — Progress Notes (Signed)
[facility] LCSW Group Therapy      Emotional Regulation 1:15 - 2:30 PM            06/20/2012 2:18 PM  Type of Therapy:  Group Therapy    Participation Level:    Did not attend group  Participation Quality:    Affect:    Cognitive:    Insight:    Engagement in Therapy:  Modes of Intervention:      Summary of Progress/Problems:  Sydney [name] 06/20/2012, 2:18 PM

## 2012-06-20 NOTE — Progress Notes (Signed)
D: Patient appropriate and cooperative with staff. Patient's mood/affect is depressed. Patient complained of lower back pain 7/10 and anxiety 10/10. Declined self inventory sheet. Did not attend groups.  A: Support and encouragement provided to patient. Scheduled medications administered per MD orders. PRN Naproxen given to patient for back pain and PRN Vistaril for anxiety. Monitor Q15 minute checks for safety.   R: Patient receptive. Patient reported decrease in back pain 5/10. Denies SI/HI/AVH. Patient remains safe on the unit.
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
## 2012-06-20 NOTE — Tx Team (Signed)
Initial Interdisciplinary Treatment Plan  PATIENT STRENGTHS: (choose at least two) Capable of independent living Supportive family/friends  PATIENT STRESSORS: Financial difficulties Medication change or noncompliance Substance abuse   PROBLEM LIST: Problem List/Patient Goals Date to be addressed Date deferred Reason deferred Estimated date of resolution  Depression 06/20/12     Substance Abuse 06/20/12     Suicide Thoughts 06/20/12                                          DISCHARGE CRITERIA:  Adequate post-discharge living arrangements Improved stabilization in mood, thinking, and/or behavior Medical problems require only outpatient monitoring Motivation to continue treatment in a less acute level of care Safe-care adequate arrangements made Verbal commitment to aftercare and medication compliance Withdrawal symptoms are absent or subacute and managed without 24-hour nursing intervention  PRELIMINARY DISCHARGE PLAN: Attend aftercare/continuing care group Attend PHP/IOP Attend 12-step recovery group Participate in family therapy Placement in alternative living arrangements Return to previous living arrangement Return to previous work or school arrangements  PATIENT/FAMIILY INVOLVEMENT: This treatment plan has been presented to and reviewed with the patient, Sydney [name], production, and/or family member.  The patient and family have been given the opportunity to ask questions and make suggestions.  Sydney [name] [facility] 06/20/2012, 3:48 AM

## 2012-06-20 NOTE — ED Notes (Signed)
Report given to [facility] Nurse, ETA 20 minutes

## 2012-06-20 NOTE — ED Notes (Signed)
Pt transferred to [facility] via [facility]

## 2012-06-20 NOTE — Progress Notes (Signed)
[facility] Group Notes:  (Counselor/Nursing/MHT/Case Management/Adjunct)  Type of Therapy:  Psychoeducational Skills  Participation Level:  Did Not Attend  Summary of Progress/Problems: Jamese attended psychoeducational group that focused on using quality time with support systems/individuals to engage in health coping skills.  Wandra Scot 06/20/2012 2:37 PM

## 2012-06-20 NOTE — [facility] Suicide Risk Assessment (Signed)
Suicide Risk Assessment  Admission Assessment     Nursing information obtained from:    Demographic factors:    Current Mental Status:    Loss Factors:    Historical Factors:    Risk Reduction Factors:     CLINICAL FACTORS:   Depression:   Anhedonia Hopelessness Impulsivity Insomnia  COGNITIVE FEATURES THAT CONTRIBUTE TO RISK:  Thought constriction (tunnel vision)    SUICIDE RISK:   Mild:  Suicidal ideation of limited frequency, intensity, duration, and specificity.  There are no identifiable plans, no associated intent, mild dysphoria and related symptoms, good self-control (both objective and subjective assessment), few other risk factors, and identifiable protective factors, including available and accessible social support.  PLAN OF CARE: Initiate clonidine protocol for opiate detox. Start medications as needed. Encourage patient to attend groups.   Sydney [name] 06/20/2012, 1:04 PM

## 2012-06-20 NOTE — H&P (Signed)
Patient seen and assessed. Agree with above assessment and recommendations. 

## 2012-06-21 DIAGNOSIS — F1120 Opioid dependence, uncomplicated: Principal | ICD-10-CM

## 2012-06-21 DIAGNOSIS — F411 Generalized anxiety disorder: Secondary | ICD-10-CM

## 2012-06-21 MED ORDER — BISACODYL 10 MG RE SUPP
10.0000 mg | Freq: Once | RECTAL | Status: AC
  Administered 2012-06-21: 10 mg via RECTAL
  Filled 2012-06-21 (×2): qty 1

## 2012-06-21 MED ORDER — DOCUSATE SODIUM 100 MG PO CAPS
100.0000 mg | ORAL_CAPSULE | Freq: Every day | ORAL | Status: DC
  Administered 2012-06-21 – 2012-06-25 (×5): 100 mg via ORAL
  Filled 2012-06-21: qty 1
  Filled 2012-06-21: qty 14
  Filled 2012-06-21 (×2): qty 1
  Filled 2012-06-21: qty 14
  Filled 2012-06-21 (×5): qty 1

## 2012-06-21 NOTE — Progress Notes (Signed)
Psychoeducational Group Note  Date:  06/21/2012 Time:  1000  Group Topic/Focus:  Self Esteem Action Plan:   The focus of this group is to help patients create a plan to continue to build self-esteem after discharge.  Participation Level:  Active  Participation Quality:  Appropriate, Attentive, Sharing and Supportive  Affect:  Appropriate  Cognitive:  Appropriate  Insight:  Engaged  Engagement in Group:  Engaged  Additional Comments:  Sydney [name] attended group and shared. Patient was asked to define self-esteem. Afterwards patient was asked what their self esteem level was like and if it was high or low self-esteem. Patient was asked to complete self esteem action plan. Patient completed and answered among the group on what ways to increase self- esteem and activities to complete self esteem. Patient discussed what were difficult and easy ways to complete the workbook.   Sydney [name] 06/21/2012, 10:55 AM

## 2012-06-21 NOTE — Progress Notes (Signed)
[facility] INPATIENT:  Family/Significant Other Suicide Prevention Education  Suicide Prevention Education:  Contact Attempts: Dewitt Rota, Friend, [phone]) has been identified by the patient as the family member/significant other with whom the patient will be residing, and identified as the person(s) who will aid the patient in the event of a mental health crisis.  With written consent from the patient, two attempts were made to provide suicide prevention education, prior to and/or following the patient's discharge.  We were unsuccessful in providing suicide prevention education.  A suicide education pamphlet was given to the patient to share with family/significant other.  Date and time of first attempt 06/21/2012 at 12:30 PM Date and time of second attempt: Wynn Banker 06/21/2012, 12:30 PM

## 2012-06-21 NOTE — Progress Notes (Signed)
D: Patient cooperative with staff. Anxious at times. Patient complained of anxiety 9/10 and toothache pain 8/10. She reported on self inventory sheet that her appetite is good, energy level is low, and ability to pay attention is poor. Patient rated depression "6" and feelings of hopelessness "8".  A: Administered scheduled medications per MD orders. PRN Vistaril given for anxiety and PRN Ibuprofen for pain. Support and encouragement provided to patient. Monitor Q15 minute observations for safety.   R: Patient receptive. Patient reported decrease in anxiety 7/10. Denies SI/HI/AVH. Patient remains safe.

## 2012-06-21 NOTE — Clinical Social Work Note (Signed)
[facility] LCSW Group Therapy      Living a Balanced Life  1:15-2:30        06/21/2012 3:50 PM    Type of Therapy:  Group Therapy  Participation Level: Did not attend  Participation Quality:    Affect:   Cognitive:    Insight:    Engagement in Therapy:    Modes of Intervention:  Discussion Exploration Problem-Solving Supportive Education  Summary of Progress/Problems:  Sydney [name] 06/21/2012 3:50 PM

## 2012-06-21 NOTE — Clinical Social Work Note (Addendum)
[facility] LCSW Aftercare Discharge Planning Group Note       8:30-9:30 AM   06/21/2012 10:33 AM   Participation Quality:  Appropriate  Affect:  Appropriate  Cognitive:  Appropriate  Insight:  Engaged  Engagement in Group:  Engaged  Modes of Intervention:  Education, Exploration, Problem-solving and Support  Summary of Progress/Problems:  Patient reports being a little better today.  She rates all symptoms at six and dendies SI/HI. Patient stated she is uncertain of transportation home at discharge.  She will need assistance with medications and outpatient referral.  Wynn Banker 06/21/2012 10:33 AM

## 2012-06-21 NOTE — Progress Notes (Signed)
D - Patient active on unit, attended group karaoke tonight. Patient continues to complain of anxiety and received PRN visteral as ordered. Patient calm and cooperative, interacting with peers appropriately. Patient denies SI/HI or auditory or visual hallucinations. Patient rates depression "4" and hopelessness "7" which is improved from earlier today.  A - Patient offered encouragement and support through therapeutic conversation. Medications given as ordered. Encouraged patient to speak with staff about any concerns or questions.  R - Patient safety maintained with Q 15 minute checks. Will continue to monitor patient.

## 2012-06-21 NOTE — Progress Notes (Signed)
Psychoeducational Group Note  Date:  06/21/2012 Time:  1100   Group Topic/Focus:  Healthy Communication:   The focus of this group is to discuss communication, barriers to communication, as well as healthy ways to communicate with others.  Participation Level:  Active  Participation Quality:  Appropriate, Attentive and Supportive  Affect:  Appropriate  Cognitive:  Alert and Appropriate  Insight:  Supportive  Engagement in Group:  Supportive  Additional Comments:    Noah Charon 06/21/2012, 12:07 PM

## 2012-06-21 NOTE — Progress Notes (Signed)
[facility] MD Progress Note  06/21/2012 10:26 AM Sydney [name]  MRN:  [id number]  Subjective:  "I still feel depressed. I learnt that Opiates do cause depression too, and I have been using opiates for many years. I was taken Xanax and Ativan too. But I have not been on either opiates and or Xanax.Ativan in 7 days.  I feel anxious a lot, I guess my body is trying to get off of these drugs. My depression today is at #6".  Diagnosis:   Axis I: Opiate dependence, Anxiety disorder Axis II: Deferred Axis III:  Past Medical History  Diagnosis Date  . Depression    Axis IV: other psychosocial or environmental problems Axis V: 41-50 serious symptoms  ADL's:  Fairly intact.  Sleep: Good  Appetite:  Good  Suicidal Ideation:  Denies  Homicidal Ideation:  Denies  AEB (as evidenced by): Per patient's reports.  Psychiatric Specialty Exam: Review of Systems  Constitutional: Negative.   HENT: Negative.   Eyes: Negative.   Cardiovascular: Negative.   Gastrointestinal: Positive for constipation.  Genitourinary: Negative.   Musculoskeletal: Positive for back pain and joint pain.  Skin: Negative.   Neurological: Negative.   Endo/Heme/Allergies: Negative.   Psychiatric/Behavioral: Positive for depression (Currently being stabilized with medication.). Negative for suicidal ideas, hallucinations and memory loss. The patient is nervous/anxious (Currently being stabilized with medication.). The patient does not have insomnia.     Blood pressure 88/60, pulse 60, temperature 98.6 F (37 C), temperature source Oral, resp. rate 16, height 5\' 8"  (1.727 m), weight 70.761 kg (156 lb).Body mass index is 23.72 kg/(m^2).  General Appearance: Casual  Eye Contact::  Good  Speech:  Clear and Coherent  Volume:  Normal  Mood:  Depressed  Affect:  Flat  Thought Process:  Coherent and Intact  Orientation:  Full (Time, Place, and Person)  Thought Content:  Rumination  Suicidal Thoughts:  No  Homicidal  Thoughts:  No  Memory:  Immediate;   Good Recent;   Good Remote;   Good  Judgement:  Fair  Insight:  Good  Psychomotor Activity:  Normal  Concentration:  Good  Recall:  Good  Akathisia:  No  Handed:  Right  AIMS (if indicated):     Assets:  Desire for Improvement  Sleep:  Number of Hours: 6.75    Current Medications: Current Facility-Administered Medications  Medication Dose Route Frequency Provider Last Rate Last Dose  . acetaminophen (TYLENOL) tablet 650 mg  650 mg Oral Q6H PRN Kerry Hough, PA      . cloNIDine (CATAPRES) tablet 0.1 mg  0.1 mg Oral QID Kerry Hough, PA   0.1 mg at 06/20/12 1657   Followed by  . cloNIDine (CATAPRES) tablet 0.1 mg  0.1 mg Oral BH-qamhs Kerry Hough, PA       Followed by  . cloNIDine (CATAPRES) tablet 0.1 mg  0.1 mg Oral QAC breakfast Kerry Hough, PA      . dicyclomine (BENTYL) tablet 20 mg  20 mg Oral Q6H PRN Kerry Hough, PA   20 mg at 06/20/12 0252  . DULoxetine (CYMBALTA) DR capsule 30 mg  30 mg Oral Daily Nanine Means, NP   30 mg at 06/21/12 0750  . gabapentin (NEURONTIN) capsule 300 mg  300 mg Oral TID Nanine Means, NP   300 mg at 06/21/12 0749  . gabapentin (NEURONTIN) capsule 300 mg  300 mg Oral QHS Nanine Means, NP   300 mg at 06/20/12 2159  .  hydrOXYzine (ATARAX/VISTARIL) tablet 25 mg  25 mg Oral Q4H PRN Nanine Means, NP   25 mg at 06/21/12 0751  . ibuprofen (ADVIL,MOTRIN) tablet 800 mg  800 mg Oral TID PRN Himabindu Ravi, MD   800 mg at 06/21/12 0935  . loperamide (IMODIUM) capsule 2-4 mg  2-4 mg Oral PRN Kerry Hough, PA      . magnesium hydroxide (MILK OF MAGNESIA) suspension 30 mL  30 mL Oral Daily PRN Kerry Hough, PA      . methocarbamol (ROBAXIN) tablet 500 mg  500 mg Oral Q8H PRN Kerry Hough, PA   500 mg at 06/20/12 0252  . metoCLOPramide (REGLAN) tablet 5 mg  5 mg Oral Q6H PRN Kerry Hough, PA      . nicotine (NICODERM CQ - dosed in mg/24 hours) patch 21 mg  21 mg Transdermal Q0600 Kerry Hough, PA    21 mg at 06/21/12 0629  . traZODone (DESYREL) tablet 50 mg  50 mg Oral QHS,MR X 1 Kerry Hough, PA   50 mg at 06/21/12 0207    Lab Results:  Results for orders placed during the hospital encounter of 06/20/12 (from the past 48 hour(s))  TSH     Status: Normal   Collection Time   06/20/12  7:34 PM      Component Value Range Comment   TSH 3.154  0.350 - 4.500 uIU/mL   HEPATIC FUNCTION PANEL     Status: Abnormal   Collection Time   06/20/12  7:40 PM      Component Value Range Comment   Total Protein 7.1  6.0 - 8.3 g/dL    Albumin 3.3 (*) 3.5 - 5.2 g/dL    AST 44 (*) 0 - 37 U/L    ALT 47 (*) 0 - 35 U/L    Alkaline Phosphatase 79  39 - 117 U/L    Total Bilirubin 0.2 (*) 0.3 - 1.2 mg/dL    Bilirubin, Direct 0.1  0.0 - 0.3 mg/dL    Indirect Bilirubin 0.1 (*) 0.3 - 0.9 mg/dL     Physical Findings: AIMS: Facial and Oral Movements Muscles of Facial Expression: None, normal Lips and Perioral Area: None, normal Jaw: None, normal Tongue: None, normal,Extremity Movements Upper (arms, wrists, hands, fingers): None, normal Lower (legs, knees, ankles, toes): None, normal,  ,  , Dental Status Current problems with teeth and/or dentures?: No Does patient usually wear dentures?: No  CIWA:  CIWA-Ar Total: 2  COWS:  COWS Total Score: 1   Treatment Plan Summary: Daily contact with patient to assess and evaluate symptoms and progress in treatment Medication management  Plan: Start Colace 100 mg daily for constipation. Dulcolax supp x 1 for constipation. Encouraged group participation. Continue current detox and treatment plan.  Medical Decision Making Problem Points:  Established problem, stable/improving (1), New problem, with additional work-up planned (4), Review of last therapy session (1) and Review of psycho-social stressors (1) Data Points:  Review of medication regiment & side effects (2) Review of new medications or change in dosage (2)  I certify that inpatient services  furnished can reasonably be expected to improve the patient's condition.   Armandina Stammer I 06/21/2012, 10:26 AM

## 2012-06-22 DIAGNOSIS — F329 Major depressive disorder, single episode, unspecified: Secondary | ICD-10-CM

## 2012-06-22 DIAGNOSIS — F3289 Other specified depressive episodes: Secondary | ICD-10-CM

## 2012-06-22 DIAGNOSIS — F411 Generalized anxiety disorder: Secondary | ICD-10-CM

## 2012-06-22 DIAGNOSIS — F1910 Other psychoactive substance abuse, uncomplicated: Secondary | ICD-10-CM

## 2012-06-22 MED ORDER — BENZOCAINE 10 % MT GEL
Freq: Four times a day (QID) | OROMUCOSAL | Status: DC | PRN
  Administered 2012-06-22 – 2012-06-24 (×3): via OROMUCOSAL
  Administered 2012-06-24: 1 via OROMUCOSAL
  Filled 2012-06-22: qty 9.4

## 2012-06-22 MED ORDER — MIRTAZAPINE 15 MG PO TABS
15.0000 mg | ORAL_TABLET | Freq: Every day | ORAL | Status: DC
Start: 2012-06-22 — End: 2012-06-23
  Administered 2012-06-22: 15 mg via ORAL
  Filled 2012-06-22 (×3): qty 1

## 2012-06-22 NOTE — Progress Notes (Addendum)
Pt stated she was upset as just spoke with her BF she reconnected with from 20 years ago. She states he does not believe that she is not here for rehab. Pt stated it upsets her that he does not trust her. Pt stated at one time in her life she and this BF did drugs together but that she has been clean for quite awhile.pt given oragel for her tooth and stated it felt better.Also given 25mg  of visteral for nerves and stated she felt better.

## 2012-06-22 NOTE — Progress Notes (Signed)
Psychoeducational Group Note  Date:  06/22/2012 Time:  1100  Group Topic/Focus:  Early Warning Signs:   The focus of this group is to help patients identify signs or symptoms they exhibit before slipping into an unhealthy state or crisis.  Participation Level:  Did Not Attend  Participation Quality:    Affect:    Cognitive:    Insight:    Engagement in Group:    Additional Comments:    Keyatta Tolles E 06/22/2012, 2:00 PM

## 2012-06-22 NOTE — Progress Notes (Signed)
[facility] LCSW Aftercare Discharge Planning Group Note  06/22/2012 2:29 PM  Participation Quality:  Appropriate, Attentive and Sharing  Affect:  bring and focused  Cognitive:  Alert, Appropriate and Oriented  Insight:  Improving  Engagement in Group:  Engaged  Modes of Intervention:  Discussion and Exploration  Summary of Progress/Problems:  Sydney [name] attended group this morning and received her morning packet. She reports today is a much better day than before and doing very well with no depression (zero) and no anxiety (zero) per report. She reports she does have follow up already arranged and medication assistance at DC as this was the main factor for her recent return, (she was out of medication). [facility] in which both have therapy and medication management. No barriers today  Or needs reported. Possibly a weekend discharge.  Nail, [name] 06/22/2012, 2:29 PM

## 2012-06-22 NOTE — Clinical Social Work Note (Signed)
[facility] LCSW Group Therapy  06/22/2012 6:19 PM  Type of Therapy:  Group Therapy  Participation Level:  Did Not Attend   Sydney [name] 06/22/2012, 6:19 PM 

## 2012-06-22 NOTE — Progress Notes (Signed)
Psychoeducational Group Note  Date:  06/22/2012 Time:  1000  Group Topic/Focus:  Wellness Toolbox:   The focus of this group is to discuss various aspects of wellness, balancing those aspects and exploring ways to increase the ability to experience wellness.  Patients will create a wellness toolbox for use upon discharge.  Participation Level:  Active  Participation Quality:  Appropriate, Sharing and Supportive  Affect:  Appropriate  Cognitive:  Appropriate  Insight:  Supportive  Engagement in Group:  Supportive  Additional Comments:  none  Chaun Uemura M 06/22/2012, 2:02 PM  

## 2012-06-22 NOTE — Progress Notes (Signed)
Writer spoke with patient after wrap up group which she attended. Patient reported in group that she was not able to stay up all day due to being very sleepy today. Patient reports that she has had a medication change and hopes to be stabilized on her medications and her goal is to not take a nap tomorrow so she can attend all the groups on tomorrow. Patient reported that her problem in the past has been that when she runs out of her medications she has not been able to refill them d/t the cost. Support and encouragement offered, safety maintained on unit with 15 min checks, will continue to monitor.

## 2012-06-22 NOTE — Progress Notes (Signed)
[facility] MD Progress Note  06/22/2012 1:58 PM Sydney [name]  MRN:  [id number] Subjective:  0/10 depression, 4/10 anxiety, 2/10 back pain Diagnosis:   Axis I: Depressive Disorder NOS, Generalized Anxiety Disorder and Substance Abuse Axis II: Deferred Axis III:  Past Medical History  Diagnosis Date  . Depression    Axis IV: economic problems, occupational problems, other psychosocial or environmental problems, problems related to social environment and problems with primary support group Axis V: 41-50 serious symptoms  ADL's:  Intact  Sleep: Poor  Appetite:  Good  Suicidal Ideation:  Denies Homicidal Ideation:  Denies  Psychiatric Specialty Exam: Review of Systems  Constitutional: Negative.   HENT: Negative.   Eyes: Negative.   Respiratory: Negative.   Cardiovascular: Negative.   Gastrointestinal: Negative.   Genitourinary: Negative.   Musculoskeletal: Positive for back pain.  Skin: Negative.   Neurological: Negative.   Endo/Heme/Allergies: Negative.   Psychiatric/Behavioral: The patient is nervous/anxious and has insomnia.     Blood pressure 97/62, pulse 85, temperature 98.4 F (36.9 C), temperature source Oral, resp. rate 16, height 5\' 8"  (1.727 m), weight 70.761 kg (156 lb).Body mass index is 23.72 kg/(m^2).  General Appearance: Casual  Eye Contact::  Fair  Speech:  Clear and Coherent  Volume:  Normal  Mood:  Anxious  Affect:  Appropriate  Thought Process:  Coherent  Orientation:  Full (Time, Place, and Person)  Thought Content:  WDL  Suicidal Thoughts:  No  Homicidal Thoughts:  No  Memory:  Immediate;   Fair Recent;   Fair Remote;   Fair  Judgement:  Fair  Insight:  Fair  Psychomotor Activity:  Decreased  Concentration:  Fair  Recall:  Fair  Akathisia:  No  Handed:  Right  AIMS (if indicated):     Assets:  Communication Skills Desire for Improvement Social Support  Sleep:  Number of Hours: 5.25    Current Medications: Current Facility-Administered  Medications  Medication Dose Route Frequency Provider Last Rate Last Dose  . acetaminophen (TYLENOL) tablet 650 mg  650 mg Oral Q6H PRN Kerry Hough, PA   650 mg at 06/22/12 1000  . benzocaine (ORAJEL) 10 % mucosal gel   Mouth/Throat QID PRN Nanine Means, NP      . cloNIDine (CATAPRES) tablet 0.1 mg  0.1 mg Oral BH-qamhs Kerry Hough, PA   0.1 mg at 06/22/12 4098   Followed by  . cloNIDine (CATAPRES) tablet 0.1 mg  0.1 mg Oral QAC breakfast Kerry Hough, PA      . dicyclomine (BENTYL) tablet 20 mg  20 mg Oral Q6H PRN Kerry Hough, PA   20 mg at 06/20/12 0252  . docusate sodium (COLACE) capsule 100 mg  100 mg Oral Daily Sanjuana Kava, NP   100 mg at 06/22/12 0827  . DULoxetine (CYMBALTA) DR capsule 30 mg  30 mg Oral Daily Nanine Means, NP   30 mg at 06/22/12 0827  . gabapentin (NEURONTIN) capsule 300 mg  300 mg Oral TID Nanine Means, NP   300 mg at 06/22/12 1206  . gabapentin (NEURONTIN) capsule 300 mg  300 mg Oral QHS Nanine Means, NP   300 mg at 06/21/12 2143  . hydrOXYzine (ATARAX/VISTARIL) tablet 25 mg  25 mg Oral Q4H PRN Nanine Means, NP   25 mg at 06/22/12 1207  . ibuprofen (ADVIL,MOTRIN) tablet 800 mg  800 mg Oral TID PRN Himabindu Ravi, MD   800 mg at 06/22/12 0357  . loperamide (IMODIUM) capsule  2-4 mg  2-4 mg Oral PRN Kerry Hough, PA      . magnesium hydroxide (MILK OF MAGNESIA) suspension 30 mL  30 mL Oral Daily PRN Kerry Hough, PA      . methocarbamol (ROBAXIN) tablet 500 mg  500 mg Oral Q8H PRN Kerry Hough, PA   500 mg at 06/20/12 0252  . metoCLOPramide (REGLAN) tablet 5 mg  5 mg Oral Q6H PRN Kerry Hough, PA      . mirtazapine (REMERON) tablet 15 mg  15 mg Oral QHS Nanine Means, NP      . nicotine (NICODERM CQ - dosed in mg/24 hours) patch 21 mg  21 mg Transdermal Q0600 Kerry Hough, PA   21 mg at 06/22/12 1610    Lab Results:  Results for orders placed during the hospital encounter of 06/20/12 (from the past 48 hour(s))  TSH     Status: Normal    Collection Time   06/20/12  7:34 PM      Component Value Range Comment   TSH 3.154  0.350 - 4.500 uIU/mL   HEPATIC FUNCTION PANEL     Status: Abnormal   Collection Time   06/20/12  7:40 PM      Component Value Range Comment   Total Protein 7.1  6.0 - 8.3 g/dL    Albumin 3.3 (*) 3.5 - 5.2 g/dL    AST 44 (*) 0 - 37 U/L    ALT 47 (*) 0 - 35 U/L    Alkaline Phosphatase 79  39 - 117 U/L    Total Bilirubin 0.2 (*) 0.3 - 1.2 mg/dL    Bilirubin, Direct 0.1  0.0 - 0.3 mg/dL    Indirect Bilirubin 0.1 (*) 0.3 - 0.9 mg/dL     Physical Findings: AIMS: Facial and Oral Movements Muscles of Facial Expression: None, normal Lips and Perioral Area: None, normal Jaw: None, normal Tongue: None, normal,Extremity Movements Upper (arms, wrists, hands, fingers): None, normal Lower (legs, knees, ankles, toes): None, normal,  ,  , Dental Status Current problems with teeth and/or dentures?: No Does patient usually wear dentures?: No  CIWA:  CIWA-Ar Total: 1  COWS:  COWS Total Score: 1   Treatment Plan Summary: Daily contact with patient to assess and evaluate symptoms and progress in treatment Medication management  Plan:  Review of chart, medications, notes, and vital signs. 1-Individual and group therapy 2-Medication management for depression, anxiety, and insomnia--remeron 15 mg daily at bedtime for sleep, oragel for toothache, patient continues with neurontin stated she "forgot how good neurontin was for pain." 3-Supportive environment for rehab 4-Discharge plan in progress to prevent relapse 5-Patient became anxious today after a phone call from her boyfriend who wants her to move to [place] but he just left his wife in October and now wants her to move in, she feels it is too soon and feels that the pressure made her go into her deep depression prior to admission--"to much for me"--coping skill development  Medical Decision Making Problem Points:  Established problem, stable/improving (1) and  Review of psycho-social stressors (1) Data Points:  Review of medication regiment & side effects (2) Review of new medications or change in dosage (2)  I certify that inpatient services furnished can reasonably be expected to improve the patient's condition.   Nanine Means, PMH-NP 06/22/2012, 1:59 PM

## 2012-06-22 NOTE — Progress Notes (Signed)
Psychoeducational Group Note  Date:  06/22/2012 Time:  2000  Group Topic/Focus:  Karaoke night   Participation Level:  Minimal  Participation Quality:  Appropriate  Affect:  Appropriate  Cognitive:  Appropriate  Insight:  Engaged  Engagement in Group:  Engaged  Additional Comments:   Pt attended karaoke group and participated.   Favour Aleshire A 06/22/2012, 3:39 AM

## 2012-06-22 NOTE — Progress Notes (Signed)
[facility] Adult Case Management Discharge Plan :  Will you be returning to the same living situation after discharge: Yes,    At discharge, do you have transportation home?:Yes,    Do you have the ability to pay for your medications:Yes,     Release of information consent forms completed and in the chart;  Patient's signature needed at discharge.  Patient to Follow up at: Follow-up Information    [facility] up with [facility] On 06/25/2012. (You are scheduled with [facility] at 4:00 PM on Monday, June 25, 2012)    Contact information:   [address]  [place], [place]  [address]    [phone]         Patient denies SI/HI:   Yes,       Safety Planning and Suicide Prevention discussed:  Yes,     Aris Georgia 06/22/2012, 11:23 AM

## 2012-06-22 NOTE — Progress Notes (Signed)
Sydney [name] present to the med window this AM...she is sad, quiet and flat. She takes her meds as ordered by the MD. She is pleasant. She completes her AM self inventory and on it she writes she denies SI within the past 24 hrs, she rates her depression and hopelessness "1/1", respectively and she states her DC plan is " get up and take meds".   A She attends her groups as scheduled in her POC. She is knowledgeable about her medication regimen.   R Therapeutic relationship fostered and POC cont.

## 2012-06-23 MED ORDER — CHLORHEXIDINE GLUCONATE 0.12 % MT SOLN
15.0000 mL | Freq: Once | OROMUCOSAL | Status: AC
  Administered 2012-06-23: 15 mL via OROMUCOSAL
  Filled 2012-06-23: qty 15

## 2012-06-23 MED ORDER — MIRTAZAPINE 15 MG PO TABS
15.0000 mg | ORAL_TABLET | Freq: Every evening | ORAL | Status: DC | PRN
  Administered 2012-06-23 – 2012-06-24 (×4): 15 mg via ORAL
  Filled 2012-06-23 (×11): qty 1

## 2012-06-23 MED ORDER — PENICILLIN V POTASSIUM 500 MG PO TABS
500.0000 mg | ORAL_TABLET | Freq: Four times a day (QID) | ORAL | Status: DC
  Administered 2012-06-23 – 2012-06-25 (×9): 500 mg via ORAL
  Filled 2012-06-23: qty 1
  Filled 2012-06-23: qty 22
  Filled 2012-06-23 (×2): qty 1
  Filled 2012-06-23: qty 22
  Filled 2012-06-23 (×2): qty 1
  Filled 2012-06-23: qty 22
  Filled 2012-06-23 (×6): qty 1
  Filled 2012-06-23: qty 22
  Filled 2012-06-23 (×3): qty 1
  Filled 2012-06-23: qty 22
  Filled 2012-06-23 (×3): qty 1
  Filled 2012-06-23: qty 22
  Filled 2012-06-23 (×3): qty 1
  Filled 2012-06-23 (×2): qty 22

## 2012-06-23 MED ORDER — CHLORHEXIDINE GLUCONATE 0.12 % MT SOLN
15.0000 mL | Freq: Two times a day (BID) | OROMUCOSAL | Status: DC
  Administered 2012-06-23 – 2012-06-25 (×4): 15 mL via OROMUCOSAL
  Filled 2012-06-23 (×10): qty 15

## 2012-06-23 NOTE — Clinical Social Work Note (Signed)
[facility] Group Notes:  (Clinical Social Work)  06/23/2012   3:00-4:00PM  Summary of Progress/Problems:   The main focus of today's process group was for the patient to identify ways in which they have in the past sabotaged their own recovery and reasons they may have done this/what they received from doing it.  We then worked to identify a specific plan to avoid doing this when discharged from the hospital for this admission.  The patient expressed that she cuts people out of her life, saying to herself "if they don't want to be around me, I don't want to be around them."  She talked about her pain medications making her be able to interact with people in her family/around her in a way that they like, and thus they sabotage her too when she goes off those addictive medications, by saying she is better with the meds.  She acknowledged that this can also become self-sabotage when she starts to agree with them, and derails her goal of getting off those medications.  She stated emphatically that the non-narcotic pain medications she received this morning have been effective.  Type of Therapy:  Group Therapy - Process  Participation Level:  Active  Participation Quality:  Appropriate, Attentive, Sharing and Supportive  Affect:  Blunted  Cognitive:  Alert, Appropriate and Oriented  Insight:  Engaged  Engagement in Therapy:  Engaged  Modes of Intervention:  Clarification, Education, Limit-setting, Problem-solving, Socialization, Support and Processing, Exploration, Discussion   Ambrose Mantle, LCSW 06/23/2012, 4:08 PM

## 2012-06-23 NOTE — Progress Notes (Signed)
Goals Group This is a group that identifies the program and helps them to start working on their packets for the day.  Each person sets a goal for the day that is measurable Pt did not attend 

## 2012-06-23 NOTE — Progress Notes (Signed)
Group Topic/Focus:  Identifying Needs:   The focus of this group is to help patients identify their personal needs that have been historically problematic and identify healthy behaviors to address their needs.  Participation Level:  Active  Participation Quality:  Appropriate  Affect:  Appropriate  Cognitive:  Appropriate and Disorganized  Insight:  Engaged  Engagement in Group:  Engaged  Additional Comments:    Aryn Kops A   

## 2012-06-23 NOTE — Progress Notes (Signed)
Psychoeducational Group Note  Date:  06/23/2012 Time:  2000  Group Topic/Focus:  Wrap-Up Group:   The focus of this group is to help patients review their daily goal of treatment and discuss progress on daily workbooks.  Participation Level: Did Not Attend  Participation Quality:  Not Applicable  Affect:  Not Applicable  Cognitive:  Not Applicable  Insight:  Not Applicable  Engagement in Group: Not Applicable  Additional Comments:  The patient slept during group time.   Hazle Coca S 06/23/2012, 10:27 PM

## 2012-06-23 NOTE — Progress Notes (Signed)
Patient ID: Sydney [name], female   DOB: 15-Jan-1961, 51 y.o.   MRN: [id number] [facility] MD Progress Note  06/23/2012 2:26 PM ELSE HABERMANN  MRN:  [id number] Subjective: Seen in room in bed c/o toothache. This caused bed dreams and poor sleep. Will treat with Peridex and Pen V K. Will repeat Remeron to help provide continuous sleep and treat depression& anxiety. Diagnosis:   Axis I: Depressive Disorder NOS, Generalized Anxiety Disorder and Substance Abuse Axis II: Deferred Axis III:  Past Medical History  Diagnosis Date  . Depression    Axis IV: economic problems, occupational problems, other psychosocial or environmental problems, problems related to social environment and problems with primary support group Axis V: 41-50 serious symptoms  ADL's:  Intact  Sleep: Poor  Appetite:  Good  Suicidal Ideation:  Denies Homicidal Ideation:  Denies  Psychiatric Specialty Exam: Review of Systems  Constitutional: Negative.   HENT: Negative.   Eyes: Negative.   Respiratory: Negative.   Cardiovascular: Negative.   Gastrointestinal: Negative.   Genitourinary: Negative.   Musculoskeletal: Positive for back pain.  Skin: Negative.   Neurological: Negative.   Endo/Heme/Allergies: Negative.   Psychiatric/Behavioral: The patient is nervous/anxious and has insomnia.     Blood pressure 106/73, pulse 125, temperature 97.6 F (36.4 C), temperature source Oral, resp. rate 18, height 5\' 8"  (1.727 m), weight 70.761 kg (156 lb).Body mass index is 23.72 kg/(m^2).  General Appearance: Casual  Eye Contact::  Fair  Speech:  Clear and Coherent  Volume:  Normal  Mood:  Anxious  Affect:  Appropriate  Thought Process:  Coherent  Orientation:  Full (Time, Place, and Person)  Thought Content:  WDL  Suicidal Thoughts:  No  Homicidal Thoughts:  No  Memory:  Immediate;   Fair Recent;   Fair Remote;   Fair  Judgement:  Fair  Insight:  Fair  Psychomotor Activity:  Decreased  Concentration:  Fair  Recall:   Fair  Akathisia:  No  Handed:  Right  AIMS (if indicated):     Assets:  Communication Skills Desire for Improvement Social Support  Sleep:  Number of Hours: 5.75    Current Medications: Current Facility-Administered Medications  Medication Dose Route Frequency Provider Last Rate Last Dose  . acetaminophen (TYLENOL) tablet 650 mg  650 mg Oral Q6H PRN Kerry Hough, PA   650 mg at 06/22/12 1000  . benzocaine (ORAJEL) 10 % mucosal gel   Mouth/Throat QID PRN Nanine Means, NP      . chlorhexidine (PERIDEX) 0.12 % solution 15 mL  15 mL Mouth/Throat BID Mickie D. Christyanna Mckeon, PA   15 mL at 06/23/12 1325  . chlorhexidine (PERIDEX) 0.12 % solution 15 mL  15 mL Mouth/Throat Once Mickie D. Alexyia Guarino, PA      . cloNIDine (CATAPRES) tablet 0.1 mg  0.1 mg Oral BH-qamhs Kerry Hough, PA   0.1 mg at 06/23/12 0827   Followed by  . cloNIDine (CATAPRES) tablet 0.1 mg  0.1 mg Oral QAC breakfast Kerry Hough, PA   0.1 mg at 06/23/12 5621  . dicyclomine (BENTYL) tablet 20 mg  20 mg Oral Q6H PRN Kerry Hough, PA   20 mg at 06/20/12 0252  . docusate sodium (COLACE) capsule 100 mg  100 mg Oral Daily Sanjuana Kava, NP   100 mg at 06/23/12 3086  . DULoxetine (CYMBALTA) DR capsule 30 mg  30 mg Oral Daily Nanine Means, NP   30 mg at 06/23/12 0826  . gabapentin (  NEURONTIN) capsule 300 mg  300 mg Oral TID Nanine Means, NP   300 mg at 06/23/12 1326  . gabapentin (NEURONTIN) capsule 300 mg  300 mg Oral QHS Nanine Means, NP   300 mg at 06/22/12 2117  . hydrOXYzine (ATARAX/VISTARIL) tablet 25 mg  25 mg Oral Q4H PRN Nanine Means, NP   25 mg at 06/23/12 1325  . ibuprofen (ADVIL,MOTRIN) tablet 800 mg  800 mg Oral TID PRN Himabindu Ravi, MD   800 mg at 06/23/12 0829  . loperamide (IMODIUM) capsule 2-4 mg  2-4 mg Oral PRN Kerry Hough, PA      . magnesium hydroxide (MILK OF MAGNESIA) suspension 30 mL  30 mL Oral Daily PRN Kerry Hough, PA      . methocarbamol (ROBAXIN) tablet 500 mg  500 mg Oral Q8H PRN Kerry Hough,  PA   500 mg at 06/23/12 0829  . metoCLOPramide (REGLAN) tablet 5 mg  5 mg Oral Q6H PRN Kerry Hough, PA      . mirtazapine (REMERON) tablet 15 mg  15 mg Oral QHS Nanine Means, NP   15 mg at 06/22/12 2117  . nicotine (NICODERM CQ - dosed in mg/24 hours) patch 21 mg  21 mg Transdermal Q0600 Kerry Hough, PA   21 mg at 06/23/12 1610  . penicillin v potassium (VEETID) tablet 500 mg  500 mg Oral QID Mickie D. Joye Wesenberg, PA   500 mg at 06/23/12 1326    Lab Results:  No results found for this or any previous visit (from the past 48 hour(s)).  Physical Findings: AIMS: Facial and Oral Movements Muscles of Facial Expression: None, normal Lips and Perioral Area: None, normal Jaw: None, normal Tongue: None, normal,Extremity Movements Upper (arms, wrists, hands, fingers): None, normal Lower (legs, knees, ankles, toes): None, normal,  ,  , Dental Status Current problems with teeth and/or dentures?: No Does patient usually wear dentures?: No  CIWA:  CIWA-Ar Total: 9  COWS:  COWS Total Score: 1   Treatment Plan Summary: Daily contact with patient to assess and evaluate symptoms and progress in treatment Medication management  Allow repeat of Remeron   Plan:  Review of chart, medications, notes, and vital signs. 1-Individual and group therapy 2-Medication management for depression, anxiety, and insomnia--remeron 15 mg daily at bedtime for sleep, oragel for toothache, patient continues with neurontin stated she "forgot how good neurontin was for pain." 3-Supportive environment for rehab 4-Discharge plan in progress to prevent relapse 5-Patient became anxious today after a phone call from her boyfriend who wants her to move to [place] but he just left his wife in October and now wants her to move in, she feels it is too soon and feels that the pressure made her go into her deep depression prior to admission--"to much for me"--coping skill development  Medical Decision Making Problem Points:   Established problem, stable/improving (1) and Review of psycho-social stressors (1) Data Points:  Review of medication regiment & side effects (2) Review of new medications or change in dosage (2)  I certify that inpatient services furnished can reasonably be expected to improve the patient's condition.   Akasha Melena,MICKIE D.,PA-C CAQ-PSych  06/23/2012, 2:26 PM

## 2012-06-23 NOTE — Progress Notes (Signed)
Sydney [name] remains uncomfortable today.stating she gets " really edgy" when the prn's wear off. She is flat, sad and depressed...anxious and stating " I don't know if I can get through this.   A She completed her self inventory this morning and on it she wrote she denied SI, she rated her depression and hopelessness "0/0" and she stated her DC plan is to " get the medication that I need".   R Safety is in place and POC cont.

## 2012-06-24 NOTE — Progress Notes (Signed)
Patient came to medication window and requested ibuprofen for her toothache pain.Patient was informed that it was too soon for ibuprofen and tylenol was offered and she requested orajel instead. Patient reports having had a good day and is hopeful about discharging soon. Patient denies si/hi/a/v hallucinations. Support and encouragement offered, safety maintained on unit, will continue to monitor.

## 2012-06-24 NOTE — Progress Notes (Signed)
Patient has been in her bed resting due to toothache pain from earlier. Patient reports that she had received orajel and tylenol earlier and wanted to rest to ease her tooth pain. Patient did not attend group but got up later and had snack and reports that she drank coffee to ease the pain. Patient reports that she has been anxious since this am and feels that she probably had better take her clonidine to help with her detox. Patient was offered support and encouragement, safety maintained on unit, pt denies si/hi/a/v hallucinations. Will continue to monitor.

## 2012-06-24 NOTE — Progress Notes (Signed)
Psychoeducational Group Note  Date:  06/24/2012 Time:  0115  Group Topic/Focus: Developing Support-system; The Ten Secrets of Happiness.  Participation Level:  Did Not Attend  Participation Quality:  N/A Patient did not attend  Affect:    Cognitive:    Insight:    Engagement in Group:    Additional Comments: Patient remained in bed, awake and asleep, for the duration of group-time today.  Gennell How, Newton Pigg 06/24/2012, 2:31 PM

## 2012-06-24 NOTE — Progress Notes (Signed)
Sydney [name] has spent the majority of the day..influenza her bed...resting. She got up and went to breakfast, took her AM meds and then went back to sleep. Although she did not attend her groups as scheduled, she did complete her AM self inventory and on it she wrote she denied SI within the past 24 hrs, and she rated her depression and hopelessness "1/1".   A She is appropriate in her interactions. She is less depressed. She demonstrates better insight into her drug addiction and she is able to verbalize her needs vs her wants.   R Safety is in place and POC cont.

## 2012-06-24 NOTE — Progress Notes (Signed)
Group Topic/Focus:  Making Healthy Choices:   The focus of this group is to help patients identify negative/unhealthy choices they were using prior to admission and identify positive/healthier coping strategies to replace them upon discharge.  Participation Level:  Did not attend     Dione Housekeeper 06/24/2012

## 2012-06-24 NOTE — Progress Notes (Signed)
[facility] MD Progress Note  06/24/2012 11:20 AM EILIS CHESTNUTT  MRN:  [id number] Subjective:  Meds have helped her tooth discomfort. Not sleeping well at night - but never does. Hoping she can be discharged to be at home with family for Christmas.  Diagnosis:  Anxiety & Opiate dependence   ADL's:  Intact  Sleep: Poor  Appetite:  Good  Suicidal Ideation:  Denies  Homicidal Ideation:  Denies  AEB (as evidenced by):  Psychiatric Specialty Exam: ROS  Blood pressure 120/83, pulse 88, temperature 97.6 F (36.4 C), temperature source Oral, resp. rate 16, height 5\' 8"  (1.727 m), weight 70.761 kg (156 lb).Body mass index is 23.72 kg/(m^2).  General Appearance: Fairly Groomed  Patent attorney::  Good  Speech:  Clear and Coherent  Volume:  Normal  Mood:  Anxious and Depressed  Affect:  Congruent  Thought Process:  Goal Directed, Intact and Linear  Orientation:  Full (Time, Place, and Person)  Thought Content:  No AVH or psychosis   Suicidal Thoughts:  No  Homicidal Thoughts:  No  Memory:  Intact   Judgement:  Intact  Insight:  Present  Psychomotor Activity:  Normal  Concentration:  Good  Recall:  Good  Akathisia:  No  Handed:  Right  AIMS (if indicated):     Assets:  Communication Skills Desire for Improvement Housing Resilience Social Support  Sleep:  Number of Hours: 6    Current Medications: Current Facility-Administered Medications  Medication Dose Route Frequency Provider Last Rate Last Dose  . acetaminophen (TYLENOL) tablet 650 mg  650 mg Oral Q6H PRN Kerry Hough, PA   650 mg at 06/22/12 1000  . benzocaine (ORAJEL) 10 % mucosal gel   Mouth/Throat QID PRN Nanine Means, NP      . chlorhexidine (PERIDEX) 0.12 % solution 15 mL  15 mL Mouth/Throat BID Mickie D. Dorine Duffey, PA   15 mL at 06/24/12 4098  . dicyclomine (BENTYL) tablet 20 mg  20 mg Oral Q6H PRN Kerry Hough, PA   20 mg at 06/20/12 0252  . docusate sodium (COLACE) capsule 100 mg  100 mg Oral Daily Sanjuana Kava, NP    100 mg at 06/24/12 0809  . DULoxetine (CYMBALTA) DR capsule 30 mg  30 mg Oral Daily Nanine Means, NP   30 mg at 06/24/12 0809  . gabapentin (NEURONTIN) capsule 300 mg  300 mg Oral TID Nanine Means, NP   300 mg at 06/24/12 0809  . gabapentin (NEURONTIN) capsule 300 mg  300 mg Oral QHS Nanine Means, NP   300 mg at 06/23/12 2152  . hydrOXYzine (ATARAX/VISTARIL) tablet 25 mg  25 mg Oral Q4H PRN Nanine Means, NP   25 mg at 06/23/12 1914  . ibuprofen (ADVIL,MOTRIN) tablet 800 mg  800 mg Oral TID PRN Himabindu Ravi, MD   800 mg at 06/24/12 1191  . loperamide (IMODIUM) capsule 2-4 mg  2-4 mg Oral PRN Kerry Hough, PA      . magnesium hydroxide (MILK OF MAGNESIA) suspension 30 mL  30 mL Oral Daily PRN Kerry Hough, PA      . methocarbamol (ROBAXIN) tablet 500 mg  500 mg Oral Q8H PRN Kerry Hough, PA   500 mg at 06/24/12 4782  . metoCLOPramide (REGLAN) tablet 5 mg  5 mg Oral Q6H PRN Kerry Hough, PA      . mirtazapine (REMERON) tablet 15 mg  15 mg Oral QHS,MR X 1 Mickie D. Pernell Dupre, [place]  15 mg at 06/23/12 2243  . nicotine (NICODERM CQ - dosed in mg/24 hours) patch 21 mg  21 mg Transdermal Q0600 Kerry Hough, PA   21 mg at 06/24/12 0809  . penicillin v potassium (VEETID) tablet 500 mg  500 mg Oral QID Mickie D. Zaylyn Bergdoll, PA   500 mg at 06/24/12 6962    Lab Results: No results found for this or any previous visit (from the past 48 hour(s)).  Physical Findings: AIMS: Facial and Oral Movements Muscles of Facial Expression: None, normal Lips and Perioral Area: None, normal Jaw: None, normal Tongue: None, normal,Extremity Movements Upper (arms, wrists, hands, fingers): None, normal Lower (legs, knees, ankles, toes): None, normal,  ,  , Dental Status Current problems with teeth and/or dentures?: No Does patient usually wear dentures?: No  CIWA:  CIWA-Ar Total: 9  COWS:  COWS Total Score: 1   Treatment Plan Summary: Daily contact with patient to assess and evaluate symptoms and progress in  treatment Medication management  Plan: Continue current plan of care. Adjust meds as indicated.   Medical Decision Making Problem Points:  Established problem, stable/improving (1), New problem, with additional work-up planned (4), Review of last therapy session (1) and Review of psycho-social stressors (1) Data Points:  Review or order clinical lab tests (1) Review or order medicine tests (1) Review and summation of old records (2) Review of new medications or change in dosage (2)  I certify that inpatient services furnished can reasonably be expected to improve the patient's condition.   Lucile Didonato,MICKIE D.PA-C CAQ-Psych 06/24/2012, 11:20 AM

## 2012-06-24 NOTE — Progress Notes (Signed)
Reviewed the note and agree with the treatment plan...Chrisandra Wiemers, MD  

## 2012-06-24 NOTE — Clinical Social Work Note (Signed)
[facility] Group Notes:  (Clinical Social Work)  06/24/2012   3:00-4:00PM  Summary of Progress/Problems:   Summary of Progress/Problems:   The main focus of today's process group was for the patient to define "support" and describe what healthy supports are, then to identify the patient's current support system and decide on other supports that can be put in place to prevent future hospitalizations.  Roleplay was used to demonstrate definitions of different types of available supports.  An emphasis was placed on using therapist, doctor, therapy groups, self-help groups and problem-specific support groups to expand supports. Additionally, psychoeducation on various symptoms of mental illness was done at patients' requests.  The patient expressed understanding and a willingness to expand her supports.  Type of Therapy:  Process Group  Participation Level:  Active  Participation Quality:  Appropriate, Attentive and Sharing  Affect:  Blunted  Cognitive:  Alert, Appropriate and Oriented  Insight:  Engaged  Engagement in Therapy:  Engaged  Modes of Intervention:  Clarification, Education, Limit-setting, Problem-solving, Socialization, Support and Processing, Exploration, Discussion, Role-Play   Ambrose Mantle, LCSW 06/24/2012, 4:38 PM

## 2012-06-24 NOTE — Progress Notes (Signed)
Reviewed and agree with plan.

## 2012-06-24 NOTE — Progress Notes (Signed)
[facility] Group Notes:  (Counselor/Nursing/MHT/Case Management/Adjunct)  06/24/2012 9:53 PM  Type of Therapy:  Psychoeducational Skills  Participation Level:  Active  Participation Quality:  Appropriate  Affect:  Appropriate  Cognitive:  Appropriate  Insight:  Improving  Engagement in Group:  Engaged  Engagement in Therapy:  Engaged  Modes of Intervention:  Education  Summary of Progress/Problems:The patient stated in group that she had a better today as compared to yesterday. First of all, she stated that she had fewer "cravings".  Secondly, she verbalized that she spent more time in the dayroom. Her goal for tomorrow is to work on "staying up all day". In addition, she intends to speak with someone regarding her aftercare plan. 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    Hazle Coca S 06/24/2012, 9:53 PM

## 2012-06-25 DIAGNOSIS — F39 Unspecified mood [affective] disorder: Secondary | ICD-10-CM

## 2012-06-25 MED ORDER — METOCLOPRAMIDE HCL 5 MG PO TABS: 5.0000 mg | ORAL_TABLET | Freq: Four times a day (QID) | ORAL | Status: DC | PRN

## 2012-06-25 MED ORDER — GABAPENTIN 600 MG PO TABS
300.0000 mg | ORAL_TABLET | Freq: Four times a day (QID) | ORAL | Status: DC
  Filled 2012-06-25: qty 28

## 2012-06-25 MED ORDER — BENZOCAINE 10 % MT GEL: Freq: Four times a day (QID) | OROMUCOSAL | Status: DC | PRN

## 2012-06-25 MED ORDER — CHLORHEXIDINE GLUCONATE 0.12 % MT SOLN: 15.0000 mL | Freq: Two times a day (BID) | OROMUCOSAL | Status: DC

## 2012-06-25 MED ORDER — MIRTAZAPINE 15 MG PO TABS: 15.0000 mg | ORAL_TABLET | Freq: Every day | ORAL | Status: DC

## 2012-06-25 MED ORDER — DSS 100 MG PO CAPS: 100.0000 mg | ORAL_CAPSULE | Freq: Every day | ORAL | Status: DC

## 2012-06-25 MED ORDER — MIRTAZAPINE 15 MG PO TABS
15.0000 mg | ORAL_TABLET | Freq: Every day | ORAL | Status: DC
  Filled 2012-06-25: qty 14

## 2012-06-25 MED ORDER — GABAPENTIN 300 MG PO CAPS: 300.0000 mg | ORAL_CAPSULE | Freq: Four times a day (QID) | ORAL | Status: DC

## 2012-06-25 MED ORDER — DULOXETINE HCL 30 MG PO CPEP: 30.0000 mg | ORAL_CAPSULE | Freq: Every day | ORAL | Status: DC

## 2012-06-25 MED ORDER — HYDROXYZINE HCL 25 MG PO TABS: 25.0000 mg | ORAL_TABLET | ORAL | Status: DC | PRN

## 2012-06-25 MED ORDER — METHOCARBAMOL 500 MG PO TABS: 500.0000 mg | ORAL_TABLET | Freq: Three times a day (TID) | ORAL | Status: DC | PRN

## 2012-06-25 MED ORDER — PENICILLIN V POTASSIUM 500 MG PO TABS: 500.0000 mg | ORAL_TABLET | Freq: Four times a day (QID) | ORAL | Status: AC

## 2012-06-25 NOTE — Progress Notes (Signed)
Psychoeducational Group Note  Date:  06/25/2012 Time:  1100  Group Topic/Focus:  Self Care:   The focus of this group is to help patients understand the importance of self-care in order to improve or restore emotional, physical, spiritual, interpersonal, and financial health.    Sharilynn Cassity Meredith 06/25/2012, 2:51 PM 

## 2012-06-25 NOTE — Discharge Summary (Signed)
Physician Discharge Summary Note  Patient:  Sydney [name] is an 51 y.o., female MRN:  [id number] DOB:  10-10-1960 Patient phone:  [phone] (home)  Patient address:   [address] [place] [address],   Date of Admission:  06/20/2012 Date of Discharge: 06/25/2012  Reason for Admission:  Depression with suicide ideations, anxiety, opiate dependency  Discharge Diagnoses: Active Problems:  Anxiety disorder  Opiate dependence  Review of Systems  Constitutional: Negative.   HENT: Negative.   Eyes: Negative.   Respiratory: Negative.   Cardiovascular: Negative.   Gastrointestinal: Negative.   Genitourinary: Negative.   Musculoskeletal: Negative.   Skin: Negative.   Neurological: Negative.   Endo/Heme/Allergies: Negative.   Psychiatric/Behavioral: The patient is nervous/anxious.    Axis Diagnosis:   AXIS I:  Depressive Disorder NOS, Generalized Anxiety Disorder, Substance Abuse and Substance Induced Mood Disorder AXIS II:  Deferred AXIS III:   Past Medical History  Diagnosis Date  . Depression    AXIS IV:  other psychosocial or environmental problems, problems related to social environment and problems with primary support group AXIS V:  61-70 mild symptoms  Level of Care:  OP  Hospital Course:  Individual and group therapy, coping skills developed for depression/anxiety/and back pain, opiate detox medically managed successfully, non-addictive medications for pain (non-narcotics) ordered during inpatient to manage back pain--successful.  Sydney [name] was initially upset about being at [facility] because she did not want to detox off of opiates but stated she is "glad she did and feels so much better."  Signs and symptoms of tooth pain with tooth breakage treated with Oragel and antibiotic while inpatient.  Sydney [name] denies suicidal/homicidal ideations, hallucinations, and withdrawal symptoms.  She is stable for discharge--Rx given and 14 day supply of medications at  discharge.  Consults:  None  Significant Diagnostic Studies:  labs: Completed and reviewed, stable  Discharge Vitals:   Blood pressure 142/83, pulse 75, temperature 98.1 F (36.7 C), temperature source Oral, resp. rate 16, height 5\' 8"  (1.727 m), weight 70.761 kg (156 lb). Body mass index is 23.72 kg/(m^2). Lab Results:   No results found for this or any previous visit (from the past 72 hour(s)).  Physical Findings: AIMS: Facial and Oral Movements Muscles of Facial Expression: None, normal Lips and Perioral Area: None, normal Jaw: None, normal Tongue: None, normal,Extremity Movements Upper (arms, wrists, hands, fingers): None, normal Lower (legs, knees, ankles, toes): None, normal,  ,  , Dental Status Current problems with teeth and/or dentures?: No Does patient usually wear dentures?: No  CIWA:  CIWA-Ar Total: 5  COWS:  COWS Total Score: 1   Psychiatric Specialty Exam: See Psychiatric Specialty Exam and Suicide Risk Assessment completed by Attending Physician prior to discharge.  Discharge destination:  Home  Is patient on multiple antipsychotic therapies at discharge:  No   Has Patient had three or more failed trials of antipsychotic monotherapy by history:  No Recommended Plan for Multiple Antipsychotic Therapies:  N/A  Discharge Orders    Future Orders Please Complete By Expires   Diet - low sodium heart healthy      Activity as tolerated - No restrictions          Medication List     As of 06/25/2012  3:46 PM    STOP taking these medications         methadone 10 MG tablet   Commonly known as: DOLOPHINE      sertraline 50 MG tablet   Commonly known as:  ZOLOFT      TAKE these medications      Indication    benzocaine 10 % mucosal gel   Commonly known as: ORAJEL   Use as directed in the mouth or throat 4 (four) times daily as needed for pain (toothache pain).    Indication: tooth ache      DSS 100 MG Caps   Take 100 mg by mouth daily.    Indication:  Constipation      DULoxetine 30 MG capsule   Commonly known as: CYMBALTA   Take 1 capsule (30 mg total) by mouth daily.    Indication: Generalized Anxiety Disorder, Major Depressive Disorder, Musculoskeletal Pain      gabapentin 300 MG capsule   Commonly known as: NEURONTIN   Take 1 capsule (300 mg total) by mouth 4 (four) times daily.    Indication: Agitation, neuropathetic pain      hydrOXYzine 25 MG tablet   Commonly known as: ATARAX/VISTARIL   Take 1 tablet (25 mg total) by mouth every 4 (four) hours as needed for anxiety.       ibuprofen 800 MG tablet   Commonly known as: ADVIL,MOTRIN   Take 800 mg by mouth every 8 (eight) hours as needed. For pain and swelling       methocarbamol 500 MG tablet   Commonly known as: ROBAXIN   Take 1 tablet (500 mg total) by mouth every 8 (eight) hours as needed.    Indication: Musculoskeletal Pain      metoCLOPramide 5 MG tablet   Commonly known as: REGLAN   Take 1 tablet (5 mg total) by mouth every 6 (six) hours as needed.    Indication: Gastroesophageal Reflux Disease      mirtazapine 15 MG tablet   Commonly known as: REMERON   Take 1 tablet (15 mg total) by mouth at bedtime.    Indication: Trouble Sleeping      penicillin v potassium 500 MG tablet   Commonly known as: VEETID   Take 1 tablet (500 mg total) by mouth 4 (four) times daily.    Indication: infection           Follow-up Information    Follow up with [facility] On 06/29/2012. (You are scheduled with [facility] at 10:00 AM.)    Contact information:   [address]  [place], [place]  [address]    [phone]         Follow-up recommendations:  Activity as tolerated, low-sodium heart healthy diet  Comments:  Patient will follow-up [facility] and stay with a church family for a couple of days after discharge  Total Discharge Time:  Greater than 30 minutes  Signed: Nanine Means, PMH-NP 06/25/2012, 3:46 PM

## 2012-06-25 NOTE — Progress Notes (Signed)
[facility] Adult Case Management Discharge Plan :  Will you be returning to the same living situation after discharge: Yes,    At discharge, do you have transportation home?:Yes,    Do you have the ability to pay for your medications:Yes,     Release of information consent forms completed and in the chart;  Patient's signature needed at discharge.  Patient to Follow up at: Follow-up Information    Follow up with [facility]. On 06/29/2012. (You are scheduled with [facility] at 10:00 AM.)    Contact information:   [address]  [place], [place]  [address]    [phone]         Patient denies SI/HI:   Yes,       Safety Planning and Suicide Prevention discussed:  yes  Aris [place] 06/25/2012, 2:25 PM

## 2012-06-25 NOTE — Progress Notes (Signed)
[facility] LCSW Group Therapy  06/25/2012 8:42 PM  Type of Therapy:  Group Therapy  Participation Level:  Active  Participation Quality:  Attentive  Affect:  Appropriate  Cognitive:  Appropriate  Insight:  Improving  Engagement in Therapy:  Engaged  Modes of Intervention:  Discussion, Exploration, Problem-solving and Support  Summary of Progress/Problems:The topic for group today was overcoming obstacles.  Pt discussed overcoming obstacles and what this means for pt.  Patient discussed looking forward to discharging home and meeting her biological father.Patient discussed the process of building the trust back with her adoptive parents and plans to keep that trust consistent.  Verna Czech [place] 06/25/2012, 8:42 PM

## 2012-06-25 NOTE — Progress Notes (Signed)
Patient ID: Sydney [name], female   DOB: [birth year], 51 y.o.   MRN: [id number] She has been discharged home and was picked up by her female roommate. She said that she was going home and pick up some things then go to a church family to stay a few days. She voiced understanding to discharge instruction and of follow up plan. She denies thoughts of SI and all belonging taken home with her.

## 2012-06-25 NOTE — Progress Notes (Signed)
[facility] LCSW Aftercare Discharge Planning Group Note  06/25/2012 9:36 AM  Participation Quality:  Appropriate, Attentive and Sharing  Affect:  Anxious, Flat and brightens upon approach and when talking to Clinical research associate  Cognitive:  Alert, Appropriate and Oriented  Insight:  Developing/Improving  Engagement in Group:  Engaged  Modes of Intervention:  Discussion, Exploration and Problem-solving  Summary of Progress/Problems:  Patient attended morning group and received morning packet. Patient doing well this morning, reports she is excited about returning home and wants to leave today. Reports she lives in [facility] outpatient and receives therapy as well as medications. Reports her medications are improved and her sleep has also improved.  She reports depression and anxiety are at a zero today.  She does report anxiety, however it is good anxiety as she is excited to leave.  Sydney [name] 06/25/2012, 9:36 AM

## 2012-06-25 NOTE — [facility] Suicide Risk Assessment (Signed)
Suicide Risk Assessment  Discharge Assessment     Demographic Factors:  Female, Caucasian, Single  Mental Status Per Nursing Assessment::   On Admission:     Current Mental Status by Physician: Patient alert and oriented to 4. Denies AH/VH/SI/HI.  Loss Factors: Financial problems/change in socioeconomic status  Historical Factors: Impulsivity  Risk Reduction Factors:   Positive social support  Continued Clinical Symptoms:  Depression:   Recent sense of peace/wellbeing  Cognitive Features That Contribute To Risk:  Thought constriction (tunnel vision)    Suicide Risk:  Minimal: No identifiable suicidal ideation.  Patients presenting with no risk factors but with morbid ruminations; may be classified as minimal risk based on the severity of the depressive symptoms  Discharge Diagnoses:   AXIS I:  Depressive Disorder NOS AXIS II:  Deferred AXIS III:   Past Medical History  Diagnosis Date  . Depression    AXIS IV:  occupational problems AXIS V:  61-70 mild symptoms  Plan Of Care/Follow-up recommendations:  Activity:  normal Diet:  normal  Is patient on multiple antipsychotic therapies at discharge:  No   Has Patient had three or more failed trials of antipsychotic monotherapy by history:  No  Recommended Plan for Multiple Antipsychotic Therapies: NA  Sydney [name] 06/25/2012, 9:52 AM

## 2012-06-26 MED ORDER — DULOXETINE HCL 30 MG PO CPEP
30.0000 mg | ORAL_CAPSULE | Freq: Every day | ORAL | Status: DC
  Filled 2012-06-26: qty 14

## 2012-06-26 NOTE — Progress Notes (Signed)
Patient Discharge Instructions:  After Visit Summary (AVS):   Faxed to:  06/26/12 Psychiatric Admission Assessment Note:   Faxed to:  06/26/12 Suicide Risk Assessment - Discharge Assessment:   Faxed to:  06/26/12 Faxed/Sent to the Next Level Care provider:  06/26/12 Faxed to [facility] @ [phone]  Jerelene Redden, 06/26/2012, 3:59 PM

## 2012-06-28 NOTE — Discharge Summary (Signed)
Reviewed

## 2013-04-28 ENCOUNTER — Encounter (HOSPITAL_COMMUNITY): Payer: Self-pay | Admitting: Emergency Medicine

## 2013-04-28 ENCOUNTER — Emergency Department (HOSPITAL_COMMUNITY)
Admission: EM | Admit: 2013-04-28 | Discharge: 2013-04-28 | Disposition: A | Discharge status: Home / Self Care | Payer: Self-pay | Attending: Emergency Medicine | Admitting: Emergency Medicine

## 2013-04-28 DIAGNOSIS — W208XXA Other cause of strike by thrown, projected or falling object, initial encounter: Secondary | ICD-10-CM | POA: Insufficient documentation

## 2013-04-28 DIAGNOSIS — F17200 Nicotine dependence, unspecified, uncomplicated: Secondary | ICD-10-CM | POA: Insufficient documentation

## 2013-04-28 DIAGNOSIS — K047 Periapical abscess without sinus: Secondary | ICD-10-CM | POA: Insufficient documentation

## 2013-04-28 DIAGNOSIS — Z8659 Personal history of other mental and behavioral disorders: Secondary | ICD-10-CM | POA: Insufficient documentation

## 2013-04-28 DIAGNOSIS — Y929 Unspecified place or not applicable: Secondary | ICD-10-CM | POA: Insufficient documentation

## 2013-04-28 DIAGNOSIS — Y939 Activity, unspecified: Secondary | ICD-10-CM | POA: Insufficient documentation

## 2013-04-28 MED ORDER — HYDROCODONE-ACETAMINOPHEN 5-325 MG PO TABS: 1.0000 | ORAL_TABLET | ORAL | Status: DC | PRN

## 2013-04-28 MED ORDER — PENICILLIN V POTASSIUM 500 MG PO TABS: 500.0000 mg | ORAL_TABLET | Freq: Three times a day (TID) | ORAL | Status: DC

## 2013-04-28 MED ORDER — HYDROCODONE-ACETAMINOPHEN 5-325 MG PO TABS
2.0000 | ORAL_TABLET | Freq: Once | ORAL | Status: AC
  Administered 2013-04-28: 2 via ORAL
  Filled 2013-04-28: qty 2

## 2013-04-28 MED ORDER — PENICILLIN V POTASSIUM 250 MG PO TABS
500.0000 mg | ORAL_TABLET | Freq: Once | ORAL | Status: AC
  Administered 2013-04-28: 500 mg via ORAL
  Filled 2013-04-28: qty 2

## 2013-04-28 NOTE — ED Notes (Signed)
Pt states  A box fell on her mouth last night ans injured her gum a knocked her partial out

## 2013-04-28 NOTE — ED Notes (Signed)
Pt had a heavy box fall and hit pt in mouth, front tooth is loose per pt and had some drainage from where box cut gum, took 800mg  Ibuprofen last night, pt states it knocked her partial out and is unable to place back in, pt denies seeing a dentist currently

## 2013-04-28 NOTE — ED Provider Notes (Signed)
CSN: [id number]     Arrival date & time 04/28/13  1131 History   First MD Initiated Contact with Patient 04/28/13 1318     Chief Complaint  Patient presents with  . Dental Injury   (Consider location/radiation/quality/duration/timing/severity/associated sxs/prior Treatment) HPI Comments: Sydney [name] is a 52 y.o. Female presenting with dental pain made worse since a box fell out of a closet last night,  Striking her across her upper mouth and "knocking" her upper partial out (not damaged).  She already had infection around her upper anterior teeth and suspected an abscess which started draining after she was hit with the box.  She has persistent pain and drainage from the site.  She denies fevers or chills and other injury.     The history is provided by the patient.    Past Medical History  Diagnosis Date  . Depression    Past Surgical History  Procedure Laterality Date  . Abdominal hysterectomy    . Appendectomy    . Left oophorectomy     No family history on file. History  Substance Use Topics  . Smoking status: Current Every Day Smoker    Types: Cigarettes  . Smokeless tobacco: Not on file  . Alcohol Use: No   OB History   Grav Para Term Preterm Abortions TAB SAB Ect Mult Living                 Review of Systems  Constitutional: Negative for fever.  HENT: Positive for dental problem. Negative for facial swelling and sore throat.   Respiratory: Negative for shortness of breath.   Musculoskeletal: Negative for neck pain and neck stiffness.    Allergies  Nubain  Home Medications   Current Outpatient Rx  Name  Route  Sig  Dispense  Refill  . ibuprofen (ADVIL,MOTRIN) 200 MG tablet   Oral   Take 400 mg by mouth 4 (four) times daily as needed for pain.         Marland Kitchen HYDROcodone-acetaminophen (NORCO/VICODIN) 5-325 MG per tablet   Oral   Take 1 tablet by mouth every 4 (four) hours as needed for pain.   20 tablet   0   . penicillin v potassium (VEETID) 500 MG  tablet   Oral   Take 1 tablet (500 mg total) by mouth 3 (three) times daily.   30 tablet   0    BP 116/82  Pulse 76  Temp(Src) 97.3 F (36.3 C) (Oral)  Resp 18  Ht 5\' 8"  (1.727 m)  Wt 150 lb (68.04 kg)  BMI 22.81 kg/m2  SpO2 100% Physical Exam  Constitutional: She is oriented to person, place, and time. She appears well-developed and well-nourished. No distress.  HENT:  Head: Normocephalic and atraumatic.  Right Ear: Tympanic membrane and external ear normal.  Left Ear: Tympanic membrane and external ear normal.  Mouth/Throat: Oropharynx is clear and moist and mucous membranes are normal. No oral lesions. No trismus in the jaw. Abnormal dentition. Dental abscesses present.  Partially edentulous (upper and lower molars).  Upper central incisor with decay, moderate receding gingiva.  There is a small draining abscess above the left central incisor.    Eyes: Conjunctivae are normal.  Neck: Normal range of motion. Neck supple.  Cardiovascular: Normal rate and normal heart sounds.   Pulmonary/Chest: Effort normal.  Abdominal: She exhibits no distension.  Musculoskeletal: Normal range of motion.  Lymphadenopathy:    She has no cervical adenopathy.  Neurological: She is alert and  oriented to person, place, and time.  Skin: Skin is warm and dry. No erythema.  Psychiatric: She has a normal mood and affect.    ED Course  Procedures (including critical care time) Labs Review Labs Reviewed - No data to display Imaging Review No results found.  EKG Interpretation   None       MDM   1. Dental abscess    Pt prescribed penicillin,  Hydrocodone.  Dental referrals given.  The patient appears reasonably screened and/or stabilized for discharge and I doubt any other medical condition or other [facility] requiring further screening, evaluation, or treatment in the ED at this time prior to discharge.       Burgess Amor, PA-C 04/28/13 1359

## 2013-05-05 NOTE — ED Provider Notes (Signed)
Medical screening examination/treatment/procedure(s) were performed by non-physician practitioner and as supervising physician I was immediately available for consultation/collaboration.  Daunte Oestreich M Clare Casto, MD 05/05/13 1840 

## 2013-05-28 ENCOUNTER — Emergency Department (HOSPITAL_COMMUNITY): Payer: Self-pay

## 2013-05-28 ENCOUNTER — Encounter (HOSPITAL_COMMUNITY): Payer: Self-pay | Admitting: Emergency Medicine

## 2013-05-28 ENCOUNTER — Emergency Department (HOSPITAL_COMMUNITY)
Admission: EM | Admit: 2013-05-28 | Discharge: 2013-05-28 | Disposition: A | Discharge status: Home / Self Care | Payer: Self-pay | Attending: Emergency Medicine | Admitting: Emergency Medicine

## 2013-05-28 DIAGNOSIS — R109 Unspecified abdominal pain: Secondary | ICD-10-CM

## 2013-05-28 DIAGNOSIS — R197 Diarrhea, unspecified: Secondary | ICD-10-CM | POA: Insufficient documentation

## 2013-05-28 DIAGNOSIS — Z8619 Personal history of other infectious and parasitic diseases: Secondary | ICD-10-CM | POA: Insufficient documentation

## 2013-05-28 DIAGNOSIS — Z8659 Personal history of other mental and behavioral disorders: Secondary | ICD-10-CM | POA: Insufficient documentation

## 2013-05-28 DIAGNOSIS — R112 Nausea with vomiting, unspecified: Secondary | ICD-10-CM | POA: Insufficient documentation

## 2013-05-28 DIAGNOSIS — F17200 Nicotine dependence, unspecified, uncomplicated: Secondary | ICD-10-CM | POA: Insufficient documentation

## 2013-05-28 DIAGNOSIS — R1084 Generalized abdominal pain: Secondary | ICD-10-CM | POA: Insufficient documentation

## 2013-05-28 HISTORY — DX: Enterocolitis due to Clostridium difficile, not specified as recurrent: A04.72

## 2013-05-28 LAB — HEPATIC FUNCTION PANEL
Alkaline Phosphatase: 75 U/L (ref 39–117)
Bilirubin, Direct: 0.1 mg/dL (ref 0.0–0.3)
Indirect Bilirubin: 0.5 mg/dL (ref 0.3–0.9)
Total Bilirubin: 0.6 mg/dL (ref 0.3–1.2)

## 2013-05-28 LAB — CBC WITH DIFFERENTIAL/PLATELET
Eosinophils Relative: 1 % (ref 0–5)
HCT: 42.2 % (ref 36.0–46.0)
Lymphocytes Relative: 17 % (ref 12–46)
Lymphs Abs: 2.2 10*3/uL (ref 0.7–4.0)
MCH: 31.1 pg (ref 26.0–34.0)
MCV: 90.6 fL (ref 78.0–100.0)
Monocytes Absolute: 0.5 10*3/uL (ref 0.1–1.0)
RBC: 4.66 MIL/uL (ref 3.87–5.11)
WBC: 13.2 10*3/uL — ABNORMAL HIGH (ref 4.0–10.5)

## 2013-05-28 LAB — LIPASE, BLOOD: Lipase: 36 U/L (ref 11–59)

## 2013-05-28 LAB — BASIC METABOLIC PANEL
BUN: 12 mg/dL (ref 6–23)
CO2: 25 mEq/L (ref 19–32)
Calcium: 9.6 mg/dL (ref 8.4–10.5)
Creatinine, Ser: 0.55 mg/dL (ref 0.50–1.10)
Glucose, Bld: 98 mg/dL (ref 70–99)

## 2013-05-28 LAB — URINALYSIS W MICROSCOPIC + REFLEX CULTURE
Ketones, ur: NEGATIVE mg/dL
Nitrite: NEGATIVE
pH: 6 (ref 5.0–8.0)

## 2013-05-28 MED ORDER — IOHEXOL 300 MG/ML  SOLN
50.0000 mL | Freq: Once | INTRAMUSCULAR | Status: AC | PRN
  Administered 2013-05-28: 50 mL via ORAL

## 2013-05-28 MED ORDER — IOHEXOL 300 MG/ML  SOLN
100.0000 mL | Freq: Once | INTRAMUSCULAR | Status: AC | PRN
  Administered 2013-05-28: 100 mL via INTRAVENOUS

## 2013-05-28 MED ORDER — SODIUM CHLORIDE 0.9 % IV SOLN
1000.0000 mL | Freq: Once | INTRAVENOUS | Status: AC
  Administered 2013-05-28: 1000 mL via INTRAVENOUS

## 2013-05-28 MED ORDER — OXYCODONE-ACETAMINOPHEN 5-325 MG PO TABS: 2.0000 | ORAL_TABLET | ORAL | Status: DC | PRN

## 2013-05-28 MED ORDER — FAMOTIDINE IN NACL 20-0.9 MG/50ML-% IV SOLN
20.0000 mg | Freq: Once | INTRAVENOUS | Status: AC
  Administered 2013-05-28: 20 mg via INTRAVENOUS
  Filled 2013-05-28: qty 50

## 2013-05-28 MED ORDER — SODIUM CHLORIDE 0.9 % IV SOLN
INTRAVENOUS | Status: DC
  Administered 2013-05-28: 14:00:00 via INTRAVENOUS
  Administered 2013-05-28: 1000 mL via INTRAVENOUS

## 2013-05-28 MED ORDER — SODIUM CHLORIDE 0.9 % IV BOLUS (SEPSIS)
500.0000 mL | Freq: Once | INTRAVENOUS | Status: AC
  Administered 2013-05-28: 500 mL via INTRAVENOUS

## 2013-05-28 MED ORDER — PROMETHAZINE HCL 25 MG PO TABS: 25.0000 mg | ORAL_TABLET | Freq: Four times a day (QID) | ORAL | Status: DC | PRN

## 2013-05-28 MED ORDER — ONDANSETRON HCL 4 MG/2ML IJ SOLN
4.0000 mg | INTRAMUSCULAR | Status: AC | PRN
  Administered 2013-05-28 (×2): 4 mg via INTRAVENOUS
  Filled 2013-05-28 (×2): qty 2

## 2013-05-28 MED ORDER — ONDANSETRON HCL 4 MG/2ML IJ SOLN
4.0000 mg | Freq: Once | INTRAMUSCULAR | Status: AC
  Administered 2013-05-28: 4 mg via INTRAVENOUS
  Filled 2013-05-28: qty 2

## 2013-05-28 MED ORDER — MORPHINE SULFATE 4 MG/ML IJ SOLN
4.0000 mg | Freq: Once | INTRAMUSCULAR | Status: AC
  Administered 2013-05-28: 4 mg via INTRAVENOUS
  Filled 2013-05-28: qty 1

## 2013-05-28 MED ORDER — MORPHINE SULFATE 4 MG/ML IJ SOLN
4.0000 mg | INTRAMUSCULAR | Status: AC | PRN
  Administered 2013-05-28 (×2): 4 mg via INTRAVENOUS
  Filled 2013-05-28 (×2): qty 1

## 2013-05-28 NOTE — ED Provider Notes (Signed)
Results of CT scan discussed with patient. No acute abdomen at discharge. She feels much better after IV hydration. Discharge medications Percocet and Phenergan 25 mg. Referral to general surgery for cholelithiasis.  Donnetta Hutching, MD 05/28/13 [phone]

## 2013-05-28 NOTE — ED Notes (Signed)
Pt c/o pain back up to 6 to abd.

## 2013-05-28 NOTE — ED Provider Notes (Signed)
CSN: [id number]     Arrival date & time 05/28/13  1056 History   First MD Initiated Contact with Patient 05/28/13 1204     Chief Complaint  Patient presents with  . Abdominal Pain  . Emesis  . Diarrhea    HPI Pt was seen at 1240.  Per pt, c/o gradual onset and persistence of constant generalized abd "pain" for the past 3 days.  Has been associated with multiple intermittent episodes of N/V/D. Describes the stools as "watery." Describes the abd pain as "sharp." States the pain "might be my gallstone again" but is unsure. Denies fevers, no back pain, no rash, no CP/SOB, no black or blood in stools or emesis.       Past Medical History  Diagnosis Date  . Depression   . C. difficile colitis    Past Surgical History  Procedure Laterality Date  . Abdominal hysterectomy    . Appendectomy    . Left oophorectomy      History  Substance Use Topics  . Smoking status: Current Every Day Smoker    Types: Cigarettes  . Smokeless tobacco: Not on file  . Alcohol Use: No    Review of Systems ROS: Statement: All systems negative except as marked or noted in the HPI; Constitutional: Negative for fever and chills. ; ; Eyes: Negative for eye pain, redness and discharge. ; ; ENMT: Negative for ear pain, hoarseness, nasal congestion, sinus pressure and sore throat. ; ; Cardiovascular: Negative for chest pain, palpitations, diaphoresis, dyspnea and peripheral edema. ; ; Respiratory: Negative for cough, wheezing and stridor. ; ; Gastrointestinal: +N/V/D, abd pain. Negative for blood in stool, hematemesis, jaundice and rectal bleeding. . ; ; Genitourinary: Negative for dysuria, flank pain and hematuria. ; ; Musculoskeletal: Negative for back pain and neck pain. Negative for swelling and trauma.; ; Skin: Negative for pruritus, rash, abrasions, blisters, bruising and skin lesion.; ; Neuro: Negative for headache, lightheadedness and neck stiffness. Negative for weakness, altered level of consciousness ,  altered mental status, extremity weakness, paresthesias, involuntary movement, seizure and syncope.     Allergies  Nubain  Home Medications   Current Outpatient Rx  Name  Route  Sig  Dispense  Refill  . ibuprofen (ADVIL,MOTRIN) 200 MG tablet   Oral   Take 400 mg by mouth 4 (four) times daily as needed for pain.         Marland Kitchen loperamide (IMODIUM) 2 MG capsule   Oral   Take 4 mg by mouth as needed for diarrhea or loose stools.          BP 118/73  Pulse 61  Temp(Src) 97.9 F (36.6 C) (Oral)  Resp 16  Ht 5\' 8"  (1.727 m)  Wt 150 lb (68.04 kg)  BMI 22.81 kg/m2  SpO2 95% Physical Exam 1245: Physical examination:  Nursing notes reviewed; Vital signs and O2 SAT reviewed;  Constitutional: Well developed, Well nourished, Uncomfortable appearing.;; Head:  Normocephalic, atraumatic; Eyes: EOMI, PERRL, No scleral icterus; ENMT: Mouth and pharynx normal, Mucous membranes dry; Neck: Supple, Full range of motion, No lymphadenopathy; Cardiovascular: Regular rate and rhythm, No gallop; Respiratory: Breath sounds clear & equal bilaterally, No wheezes.  Speaking full sentences with ease, Normal respiratory effort/excursion; Chest: Nontender, Movement normal; Abdomen: Soft, +mild diffuse tenderness to palp. No rebound or guarding. Nondistended, Normal bowel sounds. Rectal exam performed w/permission of pt and ED RN chaperone present.  Anal tone normal.  Non-tender, soft brown stool in rectal vault, heme neg.  No  fissures, no external hemorrhoids, no palp masses.; Genitourinary: No CVA tenderness; Extremities: Pulses normal, No tenderness, No edema, No calf edema or asymmetry.; Neuro: AA&Ox3, Major CN grossly intact.  Speech clear. Climbs on and off stretcher easily by herself. Gait steady. No gross focal motor or sensory deficits in extremities.; Skin: Color normal, Warm, Dry.; Psych:  Affect flat.    ED Course  Procedures   EKG Interpretation   None       MDM  MDM Reviewed: previous chart,  nursing note and vitals Reviewed previous: labs Interpretation: labs, ultrasound and x-ray   Results for orders placed during the hospital encounter of 05/28/13  CBC WITH DIFFERENTIAL      Result Value Range   WBC 13.2 (*) 4.0 - 10.5 K/uL   RBC 4.66  3.87 - 5.11 MIL/uL   Hemoglobin 14.5  12.0 - 15.0 g/dL   HCT 16.1  09.6 - 04.5 %   MCV 90.6  78.0 - 100.0 fL   MCH 31.1  26.0 - 34.0 pg   MCHC 34.4  30.0 - 36.0 g/dL   RDW 40.9  81.1 - 91.4 %   Platelets 384  150 - 400 K/uL   Neutrophils Relative % 79 (*) 43 - 77 %   Neutro Abs 10.3 (*) 1.7 - 7.7 K/uL   Lymphocytes Relative 17  12 - 46 %   Lymphs Abs 2.2  0.7 - 4.0 K/uL   Monocytes Relative 4  3 - 12 %   Monocytes Absolute 0.5  0.1 - 1.0 K/uL   Eosinophils Relative 1  0 - 5 %   Eosinophils Absolute 0.1  0.0 - 0.7 K/uL   Basophils Relative 0  0 - 1 %   Basophils Absolute 0.0  0.0 - 0.1 K/uL  BASIC METABOLIC PANEL      Result Value Range   Sodium 136  135 - 145 mEq/L   Potassium 3.3 (*) 3.5 - 5.1 mEq/L   Chloride 100  96 - 112 mEq/L   CO2 25  19 - 32 mEq/L   Glucose, Bld 98  70 - 99 mg/dL   BUN 12  6 - 23 mg/dL   Creatinine, Ser 7.82  0.50 - 1.10 mg/dL   Calcium 9.6  8.4 - 95.6 mg/dL   GFR calc non Af Amer >90  >90 mL/min   GFR calc Af Amer >90  >90 mL/min  HEPATIC FUNCTION PANEL      Result Value Range   Total Protein 8.5 (*) 6.0 - 8.3 g/dL   Albumin 3.9  3.5 - 5.2 g/dL   AST 31  0 - 37 U/L   ALT 28  0 - 35 U/L   Alkaline Phosphatase 75  39 - 117 U/L   Total Bilirubin 0.6  0.3 - 1.2 mg/dL   Bilirubin, Direct 0.1  0.0 - 0.3 mg/dL   Indirect Bilirubin 0.5  0.3 - 0.9 mg/dL  LIPASE, BLOOD      Result Value Range   Lipase 36  11 - 59 U/L  URINALYSIS W MICROSCOPIC + REFLEX CULTURE      Result Value Range   Color, Urine YELLOW  YELLOW   APPearance CLEAR  CLEAR   Specific Gravity, Urine 1.025  1.005 - 1.030   pH 6.0  5.0 - 8.0   Glucose, UA NEGATIVE  NEGATIVE mg/dL   Hgb urine dipstick SMALL (*) NEGATIVE   Bilirubin  Urine SMALL (*) NEGATIVE   Ketones, ur NEGATIVE  NEGATIVE mg/dL  Protein, ur NEGATIVE  NEGATIVE mg/dL   Urobilinogen, UA 1.0  0.0 - 1.0 mg/dL   Nitrite NEGATIVE  NEGATIVE   Leukocytes, UA SMALL (*) NEGATIVE   WBC, UA 3-6  <3 WBC/hpf   RBC / HPF 7-10  <3 RBC/hpf   Bacteria, UA MANY (*) RARE   Squamous Epithelial / LPF FEW (*) RARE   Urine-Other MUCOUS PRESENT     US Abdomen Complete 05/28/2013   CLINICAL DATA:  Mid abdomen pain  EXAM: ULTRASOUND ABDOMEN COMPLETE  COMPARISON:  None.  FINDINGS: Gallbladder  Single gallstone identified within the gallbladder. No sonographic Murphy sign noted.  Common bile duct  Diameter: Mildly enlarged measuring 7 mm.  Liver  There is a 2.7 x 2.4 x 2.2 cm minimal complicated cyst in the right lobe liver. Within normal limits in parenchymal echogenicity.  IVC  No abnormality visualized.  Pancreas  Not well visualized due to overlying bowel gas.  Spleen  Size and appearance within normal limits.  Right Kidney  Length: 9.9 cm. Echogenicity within normal limits. No mass or hydronephrosis visualized.  Left Kidney  Length: 11.8 cm. Echogenicity within normal limits. No mass or hydronephrosis visualized.  Abdominal aorta  No aneurysm visualized.  IMPRESSION: Cholelithiasis without sonographic evidence of acute cholecystitis. Minimal enlarged common bile duct measuring 7 mm.   Electronically Signed   By: Sherian Rein M.D.   On: 05/28/2013 15:09   Dg Abd Acute W/chest 05/28/2013   CLINICAL DATA:  Upper abdominal pain, nausea and vomiting.  EXAM: ACUTE ABDOMEN SERIES (ABDOMEN 2 VIEW & CHEST 1 VIEW)  COMPARISON:  PA and lateral chest 06/19/2012.  FINDINGS: Single view of the chest demonstrates clear lungs and normal heart size. No pneumothorax or pleural fluid.  Two views of the abdomen show no free intraperitoneal air. The bowel gas pattern is nonobstructive. No abnormal abdominal calcification is seen.  IMPRESSION: No acute finding chest or abdomen.   Electronically Signed    By: Drusilla Kanner M.D.   On: 05/28/2013 13:57    1600:  Pt with mild orthostasis during VS; given IVF. No vomiting or stooling while in the ED, though still c/o nausea and generalized abd pain. Denies CP/SOB. Remains afebrile. Will order CT A/P. Sign out to Dr. Adriana Simas.     Laray Anger, DO 05/28/13 1614

## 2013-05-28 NOTE — ED Notes (Signed)
Pt c/o epigastric/upper abd pain with n/v/d x 3 days. "some" blood in stool. Hx of c diff and was on antibiotics last month. gen weakness observed. Slightly pale. Mm dry. Alert/oreinted.

## 2013-05-29 LAB — URINE CULTURE: Colony Count: 15000

## 2013-06-02 ENCOUNTER — Emergency Department (HOSPITAL_COMMUNITY): Payer: Self-pay

## 2013-06-02 ENCOUNTER — Inpatient Hospital Stay (HOSPITAL_COMMUNITY)
Admission: EM | Admit: 2013-06-02 | Discharge: 2013-06-04 | DRG: 392 | Disposition: A | Discharge status: Home / Self Care | Payer: Self-pay | Attending: Internal Medicine | Admitting: Internal Medicine

## 2013-06-02 ENCOUNTER — Encounter (HOSPITAL_COMMUNITY): Payer: Self-pay | Admitting: Emergency Medicine

## 2013-06-02 DIAGNOSIS — R112 Nausea with vomiting, unspecified: Secondary | ICD-10-CM

## 2013-06-02 DIAGNOSIS — K2980 Duodenitis without bleeding: Secondary | ICD-10-CM

## 2013-06-02 DIAGNOSIS — R197 Diarrhea, unspecified: Secondary | ICD-10-CM

## 2013-06-02 DIAGNOSIS — F329 Major depressive disorder, single episode, unspecified: Secondary | ICD-10-CM | POA: Diagnosis present

## 2013-06-02 DIAGNOSIS — K5289 Other specified noninfective gastroenteritis and colitis: Principal | ICD-10-CM

## 2013-06-02 DIAGNOSIS — K529 Noninfective gastroenteritis and colitis, unspecified: Secondary | ICD-10-CM

## 2013-06-02 DIAGNOSIS — M549 Dorsalgia, unspecified: Secondary | ICD-10-CM | POA: Diagnosis present

## 2013-06-02 DIAGNOSIS — F3289 Other specified depressive episodes: Secondary | ICD-10-CM | POA: Diagnosis present

## 2013-06-02 DIAGNOSIS — D72829 Elevated white blood cell count, unspecified: Secondary | ICD-10-CM | POA: Diagnosis present

## 2013-06-02 DIAGNOSIS — F411 Generalized anxiety disorder: Secondary | ICD-10-CM | POA: Diagnosis present

## 2013-06-02 DIAGNOSIS — F419 Anxiety disorder, unspecified: Secondary | ICD-10-CM

## 2013-06-02 DIAGNOSIS — G8929 Other chronic pain: Secondary | ICD-10-CM | POA: Diagnosis present

## 2013-06-02 DIAGNOSIS — R109 Unspecified abdominal pain: Secondary | ICD-10-CM

## 2013-06-02 DIAGNOSIS — F17200 Nicotine dependence, unspecified, uncomplicated: Secondary | ICD-10-CM | POA: Diagnosis present

## 2013-06-02 HISTORY — DX: Anxiety disorder, unspecified: F41.9

## 2013-06-02 HISTORY — DX: Other chronic pain: G89.29

## 2013-06-02 HISTORY — DX: Sciatica, unspecified side: M54.30

## 2013-06-02 HISTORY — DX: Dorsalgia, unspecified: M54.9

## 2013-06-02 HISTORY — DX: Opioid dependence, uncomplicated: F11.20

## 2013-06-02 LAB — COMPREHENSIVE METABOLIC PANEL
ALT: 28 U/L (ref 0–35)
AST: 28 U/L (ref 0–37)
Albumin: 4.1 g/dL (ref 3.5–5.2)
Alkaline Phosphatase: 68 U/L (ref 39–117)
BUN: 9 mg/dL (ref 6–23)
Chloride: 104 mEq/L (ref 96–112)
GFR calc Af Amer: >90 mL/min (ref >90)
Glucose, Bld: 105 mg/dL — ABNORMAL HIGH (ref 70–99)
Potassium: 3.6 mEq/L (ref 3.5–5.1)
Sodium: 140 mEq/L (ref 135–145)
Total Bilirubin: 0.4 mg/dL (ref 0.3–1.2)
Total Protein: 8 g/dL (ref 6.0–8.3)

## 2013-06-02 LAB — CBC WITH DIFFERENTIAL/PLATELET
Basophils Absolute: 0 10*3/uL (ref 0.0–0.1)
Eosinophils Absolute: 0 10*3/uL (ref 0.0–0.7)
Hemoglobin: 14.8 g/dL (ref 12.0–15.0)
Lymphs Abs: 1.8 10*3/uL (ref 0.7–4.0)
MCH: 30.5 pg (ref 26.0–34.0)
MCHC: 33.7 g/dL (ref 30.0–36.0)
Monocytes Relative: 3 % (ref 3–12)
Neutro Abs: 9.9 10*3/uL — ABNORMAL HIGH (ref 1.7–7.7)
Neutrophils Relative %: 82 % — ABNORMAL HIGH (ref 43–77)
Platelets: 369 10*3/uL (ref 150–400)
RBC: 4.86 MIL/uL (ref 3.87–5.11)
RDW: 12.8 % (ref 11.5–15.5)
WBC: 12.1 10*3/uL — ABNORMAL HIGH (ref 4.0–10.5)

## 2013-06-02 LAB — URINALYSIS W MICROSCOPIC + REFLEX CULTURE
Bilirubin Urine: NEGATIVE
Hgb urine dipstick: NEGATIVE
Ketones, ur: NEGATIVE mg/dL
Specific Gravity, Urine: 1.015 (ref 1.005–1.030)
Urobilinogen, UA: 0.2 mg/dL (ref 0.0–1.0)
pH: 7 (ref 5.0–8.0)

## 2013-06-02 LAB — LIPASE, BLOOD: Lipase: 25 U/L (ref 11–59)

## 2013-06-02 MED ORDER — ONDANSETRON HCL 4 MG PO TABS: 4.0000 mg | ORAL_TABLET | Freq: Four times a day (QID) | ORAL | Status: DC | PRN

## 2013-06-02 MED ORDER — IOHEXOL 300 MG/ML  SOLN
50.0000 mL | Freq: Once | INTRAMUSCULAR | Status: AC | PRN
  Administered 2013-06-02: 50 mL via ORAL

## 2013-06-02 MED ORDER — IBUPROFEN 800 MG PO TABS
400.0000 mg | ORAL_TABLET | Freq: Four times a day (QID) | ORAL | Status: DC | PRN
  Administered 2013-06-03: 400 mg via ORAL
  Filled 2013-06-02: qty 1

## 2013-06-02 MED ORDER — ONDANSETRON HCL 4 MG/2ML IJ SOLN: 4.0000 mg | Freq: Three times a day (TID) | INTRAMUSCULAR | Status: AC | PRN

## 2013-06-02 MED ORDER — ENOXAPARIN SODIUM 40 MG/0.4ML ~~LOC~~ SOLN
40.0000 mg | SUBCUTANEOUS | Status: DC
  Administered 2013-06-03 – 2013-06-04 (×2): 40 mg via SUBCUTANEOUS
  Filled 2013-06-02 (×2): qty 0.4

## 2013-06-02 MED ORDER — IOHEXOL 300 MG/ML  SOLN
100.0000 mL | Freq: Once | INTRAMUSCULAR | Status: AC | PRN
  Administered 2013-06-02: 100 mL via INTRAVENOUS

## 2013-06-02 MED ORDER — FAMOTIDINE IN NACL 20-0.9 MG/50ML-% IV SOLN
20.0000 mg | Freq: Once | INTRAVENOUS | Status: AC
  Administered 2013-06-02: 20 mg via INTRAVENOUS
  Filled 2013-06-02: qty 50

## 2013-06-02 MED ORDER — CIPROFLOXACIN IN D5W 400 MG/200ML IV SOLN
400.0000 mg | Freq: Once | INTRAVENOUS | Status: AC
  Administered 2013-06-02: 400 mg via INTRAVENOUS
  Filled 2013-06-02: qty 200

## 2013-06-02 MED ORDER — PROMETHAZINE HCL 12.5 MG PO TABS: 25.0000 mg | ORAL_TABLET | Freq: Four times a day (QID) | ORAL | Status: DC | PRN

## 2013-06-02 MED ORDER — CIPROFLOXACIN IN D5W 400 MG/200ML IV SOLN
400.0000 mg | Freq: Two times a day (BID) | INTRAVENOUS | Status: DC
  Administered 2013-06-02 – 2013-06-04 (×4): 400 mg via INTRAVENOUS
  Filled 2013-06-02 (×5): qty 200

## 2013-06-02 MED ORDER — SODIUM CHLORIDE 0.9 % IV SOLN
INTRAVENOUS | Status: DC
  Administered 2013-06-02 – 2013-06-04 (×3): via INTRAVENOUS

## 2013-06-02 MED ORDER — SODIUM CHLORIDE 0.9 % IV SOLN: INTRAVENOUS | Status: AC

## 2013-06-02 MED ORDER — MORPHINE SULFATE 2 MG/ML IJ SOLN
2.0000 mg | INTRAMUSCULAR | Status: DC | PRN
  Administered 2013-06-03: 2 mg via INTRAVENOUS
  Administered 2013-06-03: 4 mg via INTRAVENOUS
  Administered 2013-06-03: 2 mg via INTRAVENOUS
  Administered 2013-06-03: 4 mg via INTRAVENOUS
  Administered 2013-06-04 (×3): 2 mg via INTRAVENOUS
  Filled 2013-06-02 (×2): qty 1
  Filled 2013-06-02: qty 2
  Filled 2013-06-02: qty 1
  Filled 2013-06-02: qty 2
  Filled 2013-06-02 (×2): qty 1

## 2013-06-02 MED ORDER — ACETAMINOPHEN 325 MG PO TABS: 650.0000 mg | ORAL_TABLET | Freq: Four times a day (QID) | ORAL | Status: DC | PRN

## 2013-06-02 MED ORDER — SODIUM CHLORIDE 0.9 % IJ SOLN
INTRAMUSCULAR | Status: AC
  Filled 2013-06-02: qty 500

## 2013-06-02 MED ORDER — ONDANSETRON HCL 4 MG/2ML IJ SOLN
4.0000 mg | INTRAMUSCULAR | Status: DC | PRN
  Administered 2013-06-02: 4 mg via INTRAVENOUS
  Filled 2013-06-02 (×4): qty 2

## 2013-06-02 MED ORDER — METRONIDAZOLE IN NACL 5-0.79 MG/ML-% IV SOLN: 500.0000 mg | Freq: Once | INTRAVENOUS | Status: DC

## 2013-06-02 MED ORDER — METRONIDAZOLE IN NACL 5-0.79 MG/ML-% IV SOLN
INTRAVENOUS | Status: AC
  Filled 2013-06-02: qty 200

## 2013-06-02 MED ORDER — METRONIDAZOLE IN NACL 5-0.79 MG/ML-% IV SOLN
500.0000 mg | Freq: Three times a day (TID) | INTRAVENOUS | Status: DC
  Administered 2013-06-03 – 2013-06-04 (×5): 500 mg via INTRAVENOUS
  Filled 2013-06-02 (×8): qty 100

## 2013-06-02 MED ORDER — CIPROFLOXACIN IN D5W 400 MG/200ML IV SOLN
INTRAVENOUS | Status: AC
  Filled 2013-06-02: qty 400

## 2013-06-02 MED ORDER — MORPHINE SULFATE 4 MG/ML IJ SOLN
4.0000 mg | INTRAMUSCULAR | Status: AC | PRN
  Administered 2013-06-02 (×2): 4 mg via INTRAVENOUS
  Filled 2013-06-02 (×2): qty 1

## 2013-06-02 MED ORDER — ONDANSETRON HCL 4 MG/2ML IJ SOLN
4.0000 mg | Freq: Four times a day (QID) | INTRAMUSCULAR | Status: DC | PRN
  Administered 2013-06-03 – 2013-06-04 (×4): 4 mg via INTRAVENOUS
  Filled 2013-06-02 (×2): qty 2

## 2013-06-02 MED ORDER — ACETAMINOPHEN 650 MG RE SUPP: 650.0000 mg | Freq: Four times a day (QID) | RECTAL | Status: DC | PRN

## 2013-06-02 MED ORDER — SODIUM CHLORIDE 0.9 % IV SOLN
INTRAVENOUS | Status: DC
  Administered 2013-06-02: 15:00:00 via INTRAVENOUS

## 2013-06-02 NOTE — ED Notes (Addendum)
Pt c/o abd pain with n/v/d was seen in er on 05/28/2013 for abd pain, n/v/d, states that she is not any better, has hx of c-diff and gallstones in past.

## 2013-06-02 NOTE — H&P (Signed)
[facility] History and Physical  [name]:[id number] DOB: Jun 11, 1961 DOA: 06/02/2013  Referring physician: Emergency Department PCP: No PCP Per Patient  Specialists:   Chief Complaint: Abdominal pain  HPI: Sydney [name] is a 52 y.o. female  Who re-presents to the ED with complaints of abd pain. Pt was seen recently in the ED, found to have a gallstone w/o cholecystits and with evidence suggestive of ileitis. Pt was subsequently discharged home however returns with continued pain. In the ED, CT abd demonstrates evidence of colitis. Also noted to have a mild leukocytosis of 12K. Hospitalist was consulted for admission  Review of Systems:  Per above, the remainder of the 10pt ros reviewed and are neg  Past Medical History  Diagnosis Date  . Depression   . C. difficile colitis   . Opiate dependence     detox 06/2012  . Anxiety   . Chronic back pain   . Sciatic pain    Past Surgical History  Procedure Laterality Date  . Abdominal hysterectomy    . Appendectomy    . Left oophorectomy     Social History:  reports that she has been smoking Cigarettes.  She has been smoking about 0.00 packs per day. She does not have any smokeless tobacco history on file. She reports that she does not drink alcohol or use illicit drugs.  where does patient live--home, ALF, SNF? and with whom if at home?  Can patient participate in ADLs?  Allergies  Allergen Reactions  . Nubain [Nalbuphine Hcl] Swelling    No family history on file.  (be sure to complete)  Prior to Admission medications   Medication Sig Start Date End Date Taking? Authorizing Provider  ibuprofen (ADVIL,MOTRIN) 200 MG tablet Take 400 mg by mouth 4 (four) times daily as needed for pain.   Yes Historical Provider, MD  loperamide (IMODIUM) 2 MG capsule Take 4 mg by mouth as needed for diarrhea or loose stools.   Yes Historical Provider, MD  oxyCODONE-acetaminophen (PERCOCET) 5-325 MG per tablet Take 2 tablets by mouth  every 4 (four) hours as needed. 05/28/13  Yes Donnetta Hutching, MD  promethazine (PHENERGAN) 25 MG tablet Take 1 tablet (25 mg total) by mouth every 6 (six) hours as needed for nausea or vomiting. 05/28/13  Yes Donnetta Hutching, MD   Physical Exam: Filed Vitals:   06/02/13 1406 06/02/13 1657  BP: 118/80 116/86  Pulse: 75 68  Temp: 97.5 F (36.4 C)   TempSrc: Oral   Resp: 18 18  Height: 5\' 8"  (1.727 m)   Weight: 68.04 kg (150 lb)   SpO2: 100% 99%     General:  Awake, in nad  Eyes: PERRL B  ENT: membranes moist, dentition fair  Neck: neck supple, trachea midline  Cardiovascular: regular, s1, s2  Respiratory: normal resp effort, no wheezing  Abdomen: soft, nondistended, tender  Skin: normal skin turgor, no abnormal skin lesions seen  Musculoskeletal: perfused, no clubbing  Psychiatric: mood/affect normal // no auditory/visual hallucinations  Neurologic: cn2-12 grossly intact, strength/sensation intact  Labs on Admission:  Basic Metabolic Panel:  Recent Labs Lab 05/28/13 1157 06/02/13 1446  NA 136 140  K 3.3* 3.6  CL 100 104  CO2 25 25  GLUCOSE 98 105*  BUN 12 9  CREATININE 0.55 0.51  CALCIUM 9.6 9.9   Liver Function Tests:  Recent Labs Lab 05/28/13 1220 06/02/13 1446  AST 31 28  ALT 28 28  ALKPHOS 75 68  BILITOT 0.6 0.4  PROT 8.5* 8.0  ALBUMIN 3.9 4.1    Recent Labs Lab 05/28/13 1220 06/02/13 1446  LIPASE 36 25   No results found for this basename: AMMONIA,  in the last 168 hours CBC:  Recent Labs Lab 05/28/13 1157 06/02/13 1446  WBC 13.2* 12.1*  NEUTROABS 10.3* 9.9*  HGB 14.5 14.8  HCT 42.2 43.9  MCV 90.6 90.3  PLT 384 369   Cardiac Enzymes: No results found for this basename: CKTOTAL, CKMB, CKMBINDEX, TROPONINI,  in the last 168 hours  BNP (last 3 results) No results found for this basename: PROBNP,  in the last 8760 hours CBG: No results found for this basename: GLUCAP,  in the last 168 hours  Radiological Exams on Admission: Dg  Chest 2 View  06/02/2013   CLINICAL DATA:  Abdominal pain  EXAM: CHEST  2 VIEW  COMPARISON:  05/28/2013  FINDINGS: Normal heart size. Clear lungs. No pleural effusion. No pneumothorax. No acute bony deformity.  IMPRESSION: No active cardiopulmonary disease.   Electronically Signed   By: Maryclare Bean M.D.   On: 06/02/2013 16:49   Ct Abdomen Pelvis W Contrast  06/02/2013   CLINICAL DATA:  Abdominal pain.  Nausea and vomiting.  EXAM: CT ABDOMEN AND PELVIS WITH CONTRAST  TECHNIQUE: Multidetector CT imaging of the abdomen and pelvis was performed using the standard protocol following bolus administration of intravenous contrast.  CONTRAST:  50mL OMNIPAQUE IOHEXOL 300 MG/ML SOLN, OMNIPAQUE IOHEXOL 300 MG/ML SOLN  COMPARISON:  CT 05/28/2013.  FINDINGS: No focal significant hepatic abnormality identified. Stable cyst posterior aspect of the liver. Spleen is normal. Gallstone again noted. No gallbladder wall thickening or pericholecystic fluid collections identified. Stable minimal prominence of the common bile duct noted. Common bile duct measures approximately 7-8 mm in diameter. The pancreas is normal. Splenic vein and portal vein are patent.  Adrenals normal. Stable simple cyst right kidney. Extrarenal pelvis on the right noted. No evidence of hydronephrosis or obstructing ureteral stone. Bladder is nondistended. Previously identified mild fullness in the of vulva and cervix again noted and direct visualization suggested. Phleboliths.  No significant adenopathy. Aorta normal caliber. Visceral vessels patent. Portal vein and splenic vein patent.  Appendectomy. Previously identified thickening along the terminal ileum no longer identified. There is no bowel distention. Minimal wall thickening of the sigmoid colon cannot be completely excluded. Similar finding in the left colon. Colitis including C difficile colitis cannot be excluded. Several slightly distended loops of small bowel are noted . Stool is present in  the colon. The stomach is nondistended. These findings may relate to mild adynamic ileus. The pylorus and duodenum wall is mildly thickened. Duodenitis cannot be excluded. No free air noted.  Mild atelectasis lung bases. Heart size normal. Small umbilical hernia with herniation of fat only. No other significant hernia noted. No acute bony abnormality. Degenerative changes lumbar spine. Tiny bony densities noted in the spine or most likely tiny bone islands.  IMPRESSION: 1. Gallstone. No evidence of cholecystitis. Stable prominence of the common bile duct to 8 mm noted. No obstructing lesion or pancreatic lesion identified. 2. Previously identified wall thickening in the region of terminal ileum is no longer identified on today's examination. The sigmoid and left colonic wall is mildly thickened. Mild changes of colitis including pseudomembranous colitis cannot be excluded. 3. Cannot exclude mild duodenitis. Cannot exclude mild adynamic ileus. 4. Again noted is mild fullness in the region of the vulva and cervix, direct visualization suggested.   Electronically Signed  ByMaisie Fus  Register   On: 06/02/2013 16:53    Assessment/Plan Principal Problem:   Colitis Active Problems:   Anxiety disorder   1. Colitis 1. Did not tolerate PO in ED 2. Cont with supportive care 3. Cont IVF 4. Would cont empiric cipro and flagyl 5. No recent abx, however, would check for Cdiff 6. Admit to med-surg 2. Anxiety 1. Stable 3. DVT prophylaxis 1. Lovenox subQ   Code Status: Full (must indicate code status--if unknown or must be presumed, indicate so) Family Communication: Pt in room (indicate person spoken with, if applicable, with phone number if by telephone) Disposition Plan: Pending (indicate anticipated LOS)  Time spent:  Asmaa Tirpak K [facility] Pager [phone]  If 7PM-7AM, please contact night-coverage [email or web address] Password [facility] 06/02/2013, 5:24 PM

## 2013-06-02 NOTE — ED Provider Notes (Signed)
CSN: [id number]     Arrival date & time 06/02/13  1403 History   First MD Initiated Contact with Patient 06/02/13 1423     Chief Complaint  Patient presents with  . Abdominal Pain    HPI Pt was seen at 1425.  Per pt, c/o gradual onset and persistence of constant generalized abd "pain" for the past 1 week.  Has been associated with multiple intermittent episodes of N/V/D.  Describes the abd pain as "sharp" and "cramping."  Denies fevers, no back pain, no rash, no CP/SOB, no black or blood in stools or emesis. The symptoms have been associated with no other complaints. The patient has a significant history of similar symptoms previously, recently being evaluated for this complaint 5 days ago in the ED. States she "didn't get any better" after that ED visit and cannot f/u with the General Surgeon or a PMD as previously instructed "because I don't have any money."   Past Medical History  Diagnosis Date  . Depression   . C. difficile colitis   . Opiate dependence     detox 06/2012  . Anxiety   . Chronic back pain   . Sciatic pain    Past Surgical History  Procedure Laterality Date  . Abdominal hysterectomy    . Appendectomy    . Left oophorectomy      History  Substance Use Topics  . Smoking status: Current Every Day Smoker    Types: Cigarettes  . Smokeless tobacco: Not on file  . Alcohol Use: No    Review of Systems ROS: Statement: All systems negative except as marked or noted in the HPI; Constitutional: Negative for fever and chills. ; ; Eyes: Negative for eye pain, redness and discharge. ; ; ENMT: Negative for ear pain, hoarseness, nasal congestion, sinus pressure and sore throat. ; ; Cardiovascular: Negative for chest pain, palpitations, diaphoresis, dyspnea and peripheral edema. ; ; Respiratory: Negative for cough, wheezing and stridor. ; ; Gastrointestinal: +N/V/D, abd pain. Negative for blood in stool, hematemesis, jaundice and rectal bleeding. . ; ; Genitourinary: Negative  for dysuria, flank pain and hematuria. ; ; Musculoskeletal: Negative for back pain and neck pain. Negative for swelling and trauma.; ; Skin: Negative for pruritus, rash, abrasions, blisters, bruising and skin lesion.; ; Neuro: Negative for headache, lightheadedness and neck stiffness. Negative for weakness, altered level of consciousness , altered mental status, extremity weakness, paresthesias, involuntary movement, seizure and syncope.       Allergies  Nubain  Home Medications   Current Outpatient Rx  Name  Route  Sig  Dispense  Refill  . ibuprofen (ADVIL,MOTRIN) 200 MG tablet   Oral   Take 400 mg by mouth 4 (four) times daily as needed for pain.         Marland Kitchen loperamide (IMODIUM) 2 MG capsule   Oral   Take 4 mg by mouth as needed for diarrhea or loose stools.         Marland Kitchen oxyCODONE-acetaminophen (PERCOCET) 5-325 MG per tablet   Oral   Take 2 tablets by mouth every 4 (four) hours as needed.   6 tablet   0   . promethazine (PHENERGAN) 25 MG tablet   Oral   Take 1 tablet (25 mg total) by mouth every 6 (six) hours as needed for nausea or vomiting.   20 tablet   0    BP 116/86  Pulse 68  Temp(Src) 97.5 F (36.4 C) (Oral)  Resp 18  Ht 5\' 8"  (  1.727 m)  Wt 150 lb (68.04 kg)  BMI 22.81 kg/m2  SpO2 99% Physical Exam 1430: Physical examination:  Nursing notes reviewed; Vital signs and O2 SAT reviewed;  Constitutional: Well developed, Well nourished, Uncomfortable appearing.; Head:  Normocephalic, atraumatic; Eyes: EOMI, PERRL, No scleral icterus; ENMT: Mouth and pharynx normal, Mucous membranes dry; Neck: Supple, Full range of motion, No lymphadenopathy; Cardiovascular: Regular rate and rhythm, No gallop; Respiratory: Breath sounds clear & equal bilaterally, No wheezes.  Speaking full sentences with ease, Normal respiratory effort/excursion; Chest: Nontender, Movement normal; Abdomen: Soft, +mild diffuse tenderness to palp. No rebound or guarding. Nondistended, Normal bowel sounds;  Genitourinary: No CVA tenderness; Extremities: Pulses normal, No tenderness, No edema, No calf edema or asymmetry.; Neuro: AA&Ox3, Major CN grossly intact.  Speech clear. No gross focal motor or sensory deficits in extremities.; Skin: Color normal, Warm, Dry.   ED Course  Procedures    EKG Interpretation   None       MDM  MDM Reviewed: previous chart, nursing note and vitals Reviewed previous: labs, x-ray, CT scan and ultrasound Interpretation: labs, x-ray and CT scan     Results for orders placed during the hospital encounter of 06/02/13  CBC WITH DIFFERENTIAL      Result Value Range   WBC 12.1 (*) 4.0 - 10.5 K/uL   RBC 4.86  3.87 - 5.11 MIL/uL   Hemoglobin 14.8  12.0 - 15.0 g/dL   HCT 30.8  65.7 - 84.6 %   MCV 90.3  78.0 - 100.0 fL   MCH 30.5  26.0 - 34.0 pg   MCHC 33.7  30.0 - 36.0 g/dL   RDW 96.2  95.2 - 84.1 %   Platelets 369  150 - 400 K/uL   Neutrophils Relative % 82 (*) 43 - 77 %   Neutro Abs 9.9 (*) 1.7 - 7.7 K/uL   Lymphocytes Relative 15  12 - 46 %   Lymphs Abs 1.8  0.7 - 4.0 K/uL   Monocytes Relative 3  3 - 12 %   Monocytes Absolute 0.4  0.1 - 1.0 K/uL   Eosinophils Relative 0  0 - 5 %   Eosinophils Absolute 0.0  0.0 - 0.7 K/uL   Basophils Relative 0  0 - 1 %   Basophils Absolute 0.0  0.0 - 0.1 K/uL  COMPREHENSIVE METABOLIC PANEL      Result Value Range   Sodium 140  135 - 145 mEq/L   Potassium 3.6  3.5 - 5.1 mEq/L   Chloride 104  96 - 112 mEq/L   CO2 25  19 - 32 mEq/L   Glucose, Bld 105 (*) 70 - 99 mg/dL   BUN 9  6 - 23 mg/dL   Creatinine, Ser 3.24  0.50 - 1.10 mg/dL   Calcium 9.9  8.4 - 40.1 mg/dL   Total Protein 8.0  6.0 - 8.3 g/dL   Albumin 4.1  3.5 - 5.2 g/dL   AST 28  0 - 37 U/L   ALT 28  0 - 35 U/L   Alkaline Phosphatase 68  39 - 117 U/L   Total Bilirubin 0.4  0.3 - 1.2 mg/dL   GFR calc non Af Amer >90  >90 mL/min   GFR calc Af Amer >90  >90 mL/min  LIPASE, BLOOD      Result Value Range   Lipase 25  11 - 59 U/L  URINALYSIS W  MICROSCOPIC + REFLEX CULTURE      Result Value Range  Color, Urine YELLOW  YELLOW   APPearance CLEAR  CLEAR   Specific Gravity, Urine 1.015  1.005 - 1.030   pH 7.0  5.0 - 8.0   Glucose, UA NEGATIVE  NEGATIVE mg/dL   Hgb urine dipstick NEGATIVE  NEGATIVE   Bilirubin Urine NEGATIVE  NEGATIVE   Ketones, ur NEGATIVE  NEGATIVE mg/dL   Protein, ur NEGATIVE  NEGATIVE mg/dL   Urobilinogen, UA 0.2  0.0 - 1.0 mg/dL   Nitrite NEGATIVE  NEGATIVE   Leukocytes, UA MODERATE (*) NEGATIVE   WBC, UA 7-10  <3 WBC/hpf   Bacteria, UA FEW (*) RARE   Squamous Epithelial / LPF FEW (*) RARE   Dg Chest 2 View 06/02/2013   CLINICAL DATA:  Abdominal pain  EXAM: CHEST  2 VIEW  COMPARISON:  05/28/2013  FINDINGS: Normal heart size. Clear lungs. No pleural effusion. No pneumothorax. No acute bony deformity.  IMPRESSION: No active cardiopulmonary disease.   Electronically Signed   By: Maryclare Bean M.D.   On: 06/02/2013 16:49   US Abdomen Complete 05/28/2013   CLINICAL DATA:  Mid abdomen pain  EXAM: ULTRASOUND ABDOMEN COMPLETE  COMPARISON:  None.  FINDINGS: Gallbladder  Single gallstone identified within the gallbladder. No sonographic Murphy sign noted.  Common bile duct  Diameter: Mildly enlarged measuring 7 mm.  Liver  There is a 2.7 x 2.4 x 2.2 cm minimal complicated cyst in the right lobe liver. Within normal limits in parenchymal echogenicity.  IVC  No abnormality visualized.  Pancreas  Not well visualized due to overlying bowel gas.  Spleen  Size and appearance within normal limits.  Right Kidney  Length: 9.9 cm. Echogenicity within normal limits. No mass or hydronephrosis visualized.  Left Kidney  Length: 11.8 cm. Echogenicity within normal limits. No mass or hydronephrosis visualized.  Abdominal aorta  No aneurysm visualized.  IMPRESSION: Cholelithiasis without sonographic evidence of acute cholecystitis. Minimal enlarged common bile duct measuring 7 mm.   Electronically Signed   By: Sherian Rein M.D.   On: 05/28/2013  15:09   Ct Abdomen Pelvis W Contrast 06/02/2013   CLINICAL DATA:  Abdominal pain.  Nausea and vomiting.  EXAM: CT ABDOMEN AND PELVIS WITH CONTRAST  TECHNIQUE: Multidetector CT imaging of the abdomen and pelvis was performed using the standard protocol following bolus administration of intravenous contrast.  CONTRAST:  50mL OMNIPAQUE IOHEXOL 300 MG/ML SOLN, OMNIPAQUE IOHEXOL 300 MG/ML SOLN  COMPARISON:  CT 05/28/2013.  FINDINGS: No focal significant hepatic abnormality identified. Stable cyst posterior aspect of the liver. Spleen is normal. Gallstone again noted. No gallbladder wall thickening or pericholecystic fluid collections identified. Stable minimal prominence of the common bile duct noted. Common bile duct measures approximately 7-8 mm in diameter. The pancreas is normal. Splenic vein and portal vein are patent.  Adrenals normal. Stable simple cyst right kidney. Extrarenal pelvis on the right noted. No evidence of hydronephrosis or obstructing ureteral stone. Bladder is nondistended. Previously identified mild fullness in the of vulva and cervix again noted and direct visualization suggested. Phleboliths.  No significant adenopathy. Aorta normal caliber. Visceral vessels patent. Portal vein and splenic vein patent.  Appendectomy. Previously identified thickening along the terminal ileum no longer identified. There is no bowel distention. Minimal wall thickening of the sigmoid colon cannot be completely excluded. Similar finding in the left colon. Colitis including C difficile colitis cannot be excluded. Several slightly distended loops of small bowel are noted . Stool is present in the colon. The stomach is nondistended. These  findings may relate to mild adynamic ileus. The pylorus and duodenum wall is mildly thickened. Duodenitis cannot be excluded. No free air noted.  Mild atelectasis lung bases. Heart size normal. Small umbilical hernia with herniation of fat only. No other significant hernia  noted. No acute bony abnormality. Degenerative changes lumbar spine. Tiny bony densities noted in the spine or most likely tiny bone islands.  IMPRESSION: 1. Gallstone. No evidence of cholecystitis. Stable prominence of the common bile duct to 8 mm noted. No obstructing lesion or pancreatic lesion identified. 2. Previously identified wall thickening in the region of terminal ileum is no longer identified on today's examination. The sigmoid and left colonic wall is mildly thickened. Mild changes of colitis including pseudomembranous colitis cannot be excluded. 3. Cannot exclude mild duodenitis. Cannot exclude mild adynamic ileus. 4. Again noted is mild fullness in the region of the vulva and cervix, direct visualization suggested.   Electronically Signed   By: Maisie Fus  Register   On: 06/02/2013 16:53    1720:  Mild colitis and duodenitis on CT scan. No change in CBD dilatation from previous ED eval on 05/28/13. LFT's continue normal. Continues to c/o pain and nausea; will re-medicate. Will start IV cipro/flagyl, IV pepcid, observation admit. Dx and testing d/w pt.  Questions answered.  Verb understanding, agreeable to observation admit. T/C to [facility] Dr. Rhona Leavens, case discussed, including:  HPI, pertinent PM/SHx, VS/PE, dx testing, ED course and treatment:  Agreeable to observation admit, requests to write temporary orders, obtain medical bed to team 2.      Laray Anger, DO 06/03/13 1216

## 2013-06-02 NOTE — Progress Notes (Signed)
ANTIBIOTIC CONSULT NOTE - INITIAL  Pharmacy Consult for Cipro Indication: Colitis  Allergies  Allergen Reactions  . Nubain [Nalbuphine Hcl] Swelling    Patient Measurements: Height: 5\' 8"  (172.7 cm) Weight: 150 lb (68.04 kg) IBW/kg (Calculated) : 63.9 Adjusted Body Weight:   Vital Signs: Temp: 97.5 F (36.4 C) (11/30 1406) Temp src: Oral (11/30 1406) BP: 116/86 mmHg (11/30 1657) Pulse Rate: 68 (11/30 1657) Intake/Output from previous day:   Intake/Output from this shift:    Labs:  Recent Labs  06/02/13 1446  WBC 12.1*  HGB 14.8  PLT 369  CREATININE 0.51   Estimated Creatinine Clearance: 83.9 ml/min (by C-G formula based on Cr of 0.51). No results found for this basename: VANCOTROUGH, Leodis Binet, VANCORANDOM, GENTTROUGH, GENTPEAK, GENTRANDOM, TOBRATROUGH, TOBRAPEAK, TOBRARND, AMIKACINPEAK, AMIKACINTROU, AMIKACIN,  in the last 72 hours   Microbiology: Recent Results (from the past 720 hour(s))  URINE CULTURE     Status: None   Collection Time    05/28/13 11:45 AM      Result Value Range Status   Specimen Description URINE, CLEAN CATCH   Final   Special Requests NONE   Final   Culture  Setup Time     Final   Value: 05/28/2013 17:04     Performed at [facility] Count     Final   Value: 15,000 COLONIES/ML     Performed at [facility]   Culture     Final   Value: Multiple bacterial morphotypes present, none predominant. Suggest appropriate recollection if clinically indicated.     Performed at [facility]   Report Status 05/29/2013 FINAL   Final    Medical History: Past Medical History  Diagnosis Date  . Depression   . C. difficile colitis   . Opiate dependence     detox 06/2012  . Anxiety   . Chronic back pain   . Sciatic pain     Medications:  Scheduled:  . enoxaparin (LOVENOX) injection  40 mg Subcutaneous Q24H   Assessment Colitis Presently cannot tolerate po Good renal function Flagyl 500 mg IV every 8  hours started  Goal of Therapy:  Eradicate infection  Plan:  Cipro 400 mg IV every 12 hours Monitor renal function Labs per protocol  Raquel James, Taiga Lupinacci Bennett 06/02/2013,6:36 PM

## 2013-06-03 DIAGNOSIS — F411 Generalized anxiety disorder: Secondary | ICD-10-CM

## 2013-06-03 LAB — URINE CULTURE: Colony Count: 35000

## 2013-06-03 LAB — CLOSTRIDIUM DIFFICILE BY PCR: Toxigenic C. Difficile by PCR: NEGATIVE

## 2013-06-03 MED ORDER — CIPROFLOXACIN HCL 500 MG PO TABS: 500.0000 mg | ORAL_TABLET | Freq: Two times a day (BID) | ORAL | Status: DC

## 2013-06-03 MED ORDER — METRONIDAZOLE 500 MG PO TABS: 500.0000 mg | ORAL_TABLET | Freq: Three times a day (TID) | ORAL | Status: DC

## 2013-06-03 MED ORDER — ONDANSETRON HCL 4 MG PO TABS: 4.0000 mg | ORAL_TABLET | Freq: Four times a day (QID) | ORAL | Status: DC | PRN

## 2013-06-03 MED FILL — Oxycodone w/ Acetaminophen Tab 5-325 MG: ORAL | Qty: 6 | Status: AC

## 2013-06-03 NOTE — Progress Notes (Signed)
[facility] PROGRESS NOTE  Sydney [name] ZOX:[id number] DOB: April 22, 1961 DOA: 06/02/2013 PCP: No PCP Per Patient  Assessment/Plan: 1. Colitis  1. Did not tolerate PO in ED or on the floor 2. Cont with supportive care 3. Cont IVF 4. Would cont empiric cipro and flagyl 5. No recent abx, however,f/u Cdiff 2. Anxiety  1. Stable 3. DVT prophylaxis  1. Lovenox subQ  Code Status: Full Family Communication: Pt in room (indicate person spoken with, relationship, and if by phone, the number) Disposition Plan: Pending  Antibiotics:  Ciprofloxacin 06/02/13>>>  Flagyl 06/02/13>>>  HPI/Subjective: Reports feeling better, however, notes abd cramping with diarrhea shortly after eating.  Objective: Filed Vitals:   06/02/13 1657 06/02/13 2017 06/03/13 0627 06/03/13 1431  BP: 116/86 124/82 115/71 127/90  Pulse: 68 57 60 57  Temp:  97.6 F (36.4 C) 97.6 F (36.4 C) 98.2 F (36.8 C)  TempSrc:  Oral Oral Oral  Resp: 18 18 18 18   Height:  5\' 8"  (1.727 m)    Weight:  66.543 kg (146 lb 11.2 oz)    SpO2: 99% 96% 100% 100%    Intake/Output Summary (Last 24 hours) at 06/03/13 1542 Last data filed at 06/03/13 1431  Gross per 24 hour  Intake 1502.5 ml  Output    650 ml  Net  852.5 ml   Filed Weights   06/02/13 1406 06/02/13 2017  Weight: 68.04 kg (150 lb) 66.543 kg (146 lb 11.2 oz)    Exam:   General:  Awake, in nad  Cardiovascular: regular, s1, s2  Respiratory: normal resp effort, no wheezing  Abdomen: soft, nondistended  Musculoskeletal: perfused, no clubbnig   Data Reviewed: Basic Metabolic Panel:  Recent Labs Lab 05/28/13 1157 06/02/13 1446  NA 136 140  K 3.3* 3.6  CL 100 104  CO2 25 25  GLUCOSE 98 105*  BUN 12 9  CREATININE 0.55 0.51  CALCIUM 9.6 9.9   Liver Function Tests:  Recent Labs Lab 05/28/13 1220 06/02/13 1446  AST 31 28  ALT 28 28  ALKPHOS 75 68  BILITOT 0.6 0.4  PROT 8.5* 8.0  ALBUMIN 3.9 4.1    Recent Labs Lab 05/28/13 1220  06/02/13 1446  LIPASE 36 25   No results found for this basename: AMMONIA,  in the last 168 hours CBC:  Recent Labs Lab 05/28/13 1157 06/02/13 1446  WBC 13.2* 12.1*  NEUTROABS 10.3* 9.9*  HGB 14.5 14.8  HCT 42.2 43.9  MCV 90.6 90.3  PLT 384 369   Cardiac Enzymes: No results found for this basename: CKTOTAL, CKMB, CKMBINDEX, TROPONINI,  in the last 168 hours BNP (last 3 results) No results found for this basename: PROBNP,  in the last 8760 hours CBG: No results found for this basename: GLUCAP,  in the last 168 hours  Recent Results (from the past 240 hour(s))  URINE CULTURE     Status: None   Collection Time    05/28/13 11:45 AM      Result Value Range Status   Specimen Description URINE, CLEAN CATCH   Final   Special Requests NONE   Final   Culture  Setup Time     Final   Value: 05/28/2013 17:04     Performed at [facility] Count     Final   Value: 15,000 COLONIES/ML     Performed at [facility]   Culture     Final   Value: Multiple bacterial morphotypes present, none predominant.  Suggest appropriate recollection if clinically indicated.     Performed at [facility]   Report Status 05/29/2013 FINAL   Final  URINE CULTURE     Status: None   Collection Time    06/02/13  4:17 PM      Result Value Range Status   Specimen Description URINE, CLEAN CATCH   Final   Special Requests NONE   Final   Culture  Setup Time     Final   Value: 06/02/2013 19:35     Performed at [facility] Count     Final   Value: 35,000 COLONIES/ML     Performed at [facility]   Culture     Final   Value: Multiple bacterial morphotypes present, none predominant. Suggest appropriate recollection if clinically indicated.     Performed at [facility]   Report Status 06/03/2013 FINAL   Final     Studies: Dg Chest 2 View  06/02/2013   CLINICAL DATA:  Abdominal pain  EXAM: CHEST  2 VIEW  COMPARISON:  05/28/2013  FINDINGS:  Normal heart size. Clear lungs. No pleural effusion. No pneumothorax. No acute bony deformity.  IMPRESSION: No active cardiopulmonary disease.   Electronically Signed   By: Maryclare Bean M.D.   On: 06/02/2013 16:49   Ct Abdomen Pelvis W Contrast  06/02/2013   CLINICAL DATA:  Abdominal pain.  Nausea and vomiting.  EXAM: CT ABDOMEN AND PELVIS WITH CONTRAST  TECHNIQUE: Multidetector CT imaging of the abdomen and pelvis was performed using the standard protocol following bolus administration of intravenous contrast.  CONTRAST:  50mL OMNIPAQUE IOHEXOL 300 MG/ML SOLN, OMNIPAQUE IOHEXOL 300 MG/ML SOLN  COMPARISON:  CT 05/28/2013.  FINDINGS: No focal significant hepatic abnormality identified. Stable cyst posterior aspect of the liver. Spleen is normal. Gallstone again noted. No gallbladder wall thickening or pericholecystic fluid collections identified. Stable minimal prominence of the common bile duct noted. Common bile duct measures approximately 7-8 mm in diameter. The pancreas is normal. Splenic vein and portal vein are patent.  Adrenals normal. Stable simple cyst right kidney. Extrarenal pelvis on the right noted. No evidence of hydronephrosis or obstructing ureteral stone. Bladder is nondistended. Previously identified mild fullness in the of vulva and cervix again noted and direct visualization suggested. Phleboliths.  No significant adenopathy. Aorta normal caliber. Visceral vessels patent. Portal vein and splenic vein patent.  Appendectomy. Previously identified thickening along the terminal ileum no longer identified. There is no bowel distention. Minimal wall thickening of the sigmoid colon cannot be completely excluded. Similar finding in the left colon. Colitis including C difficile colitis cannot be excluded. Several slightly distended loops of small bowel are noted . Stool is present in the colon. The stomach is nondistended. These findings may relate to mild adynamic ileus. The pylorus and duodenum  wall is mildly thickened. Duodenitis cannot be excluded. No free air noted.  Mild atelectasis lung bases. Heart size normal. Small umbilical hernia with herniation of fat only. No other significant hernia noted. No acute bony abnormality. Degenerative changes lumbar spine. Tiny bony densities noted in the spine or most likely tiny bone islands.  IMPRESSION: 1. Gallstone. No evidence of cholecystitis. Stable prominence of the common bile duct to 8 mm noted. No obstructing lesion or pancreatic lesion identified. 2. Previously identified wall thickening in the region of terminal ileum is no longer identified on today's examination. The sigmoid and left colonic wall is mildly thickened. Mild changes of colitis  including pseudomembranous colitis cannot be excluded. 3. Cannot exclude mild duodenitis. Cannot exclude mild adynamic ileus. 4. Again noted is mild fullness in the region of the vulva and cervix, direct visualization suggested.   Electronically Signed   By: Maisie Fus  Register   On: 06/02/2013 16:53    Scheduled Meds: . ciprofloxacin  400 mg Intravenous Q12H  . enoxaparin (LOVENOX) injection  40 mg Subcutaneous Q24H  . metronidazole  500 mg Intravenous Q8H   Continuous Infusions: . sodium chloride Stopped (06/02/13 2006)  . sodium chloride 75 mL/hr at 06/03/13 1203    Principal Problem:   Colitis Active Problems:   Anxiety disorder  Time spent:  CHIU, STEPHEN K  [facility] Pager [phone]. If 7PM-7AM, please contact night-coverage at [email or web address], password [facility] 06/03/2013, 3:42 PM  LOS: 1 day

## 2013-06-03 NOTE — Care Management Note (Addendum)
    Page 1 of 1   06/04/2013     3:53:50 PM   CARE MANAGEMENT NOTE 06/04/2013  Patient:  Sydney [name], Sydney [name]   Account Number:  [id number]  Date Initiated:  06/03/2013  Documentation initiated by:  Sharrie Rothman  Subjective/Objective Assessment:   Pt admitted from home with colitis. Pt lives alone and will return home at discharge. Pt is independent with ADL's.     Action/Plan:   Pt may need voucher at discharge. Will continue to follow for discharge planning needs.   Anticipated DC Date:  06/06/2013   Anticipated DC Plan:  HOME/SELF CARE      DC Planning Services  CM consult  MATCH Program      Choice offered to / List presented to:             Status of service:  Completed, signed off Medicare Important Message given?   (If response is "NO", the following Medicare IM given date fields will be blank) Date Medicare IM given:   Date Additional Medicare IM given:    Discharge Disposition:  HOME/SELF CARE  Per UR Regulation:    If discussed at Long Length of Stay Meetings, dates discussed:    Comments:  06/04/13 1555 Arlyss Queen, RN BSN CM Pt discharged home today. MATCH voucher given for medication assistance.  06/03/13 1530 Arlyss Queen, RN BSN CM

## 2013-06-04 LAB — CBC
Hemoglobin: 13.9 g/dL (ref 12.0–15.0)
MCV: 92.2 fL (ref 78.0–100.0)
Platelets: 318 10*3/uL (ref 150–400)
RBC: 4.51 MIL/uL (ref 3.87–5.11)
WBC: 7.8 10*3/uL (ref 4.0–10.5)

## 2013-06-04 MED ORDER — LOPERAMIDE HCL 2 MG PO CAPS
2.0000 mg | ORAL_CAPSULE | ORAL | Status: DC | PRN
  Administered 2013-06-04: 2 mg via ORAL
  Filled 2013-06-04: qty 1

## 2013-06-04 MED ORDER — CIPROFLOXACIN HCL 500 MG PO TABS: 500.0000 mg | ORAL_TABLET | Freq: Two times a day (BID) | ORAL | Status: DC

## 2013-06-04 NOTE — Discharge Summary (Addendum)
Physician Discharge Summary  Sydney [name] ZOX:[id number] DOB: 31-Mar-1961 DOA: 06/02/2013  PCP: No PCP Per Patient  Admit date: 06/02/2013 Discharge date: 06/04/2013  Time spent: 35 minutes  Recommendations for Outpatient Follow-up:  1. Follow up with PCP in 1-2 weeks  Discharge Diagnoses:  Principal Problem:   Colitis Active Problems:   Anxiety disorder   Discharge Condition: Improved  Diet recommendation: Soft diet, advance as tolerated  Filed Weights   06/02/13 1406 06/02/13 2017  Weight: 68.04 kg (150 lb) 66.543 kg (146 lb 11.2 oz)    History of present illness:  Sydney [name] is a 52 y.o. female  Who re-presents to the ED with complaints of abd pain. Pt was seen recently in the ED, found to have a gallstone w/o cholecystits and with evidence suggestive of ileitis. Pt was subsequently discharged home however returns with continued pain. In the ED, CT abd demonstrates evidence of colitis. Also noted to have a mild leukocytosis of 12K. Hospitalist was consulted for admission  Hospital Course:  1. Colitis  1. Did not tolerate PO in ED or on the floor 2. Was cont with supportive care 3. Would cont empiric cipro and flagyl 4. Cdiff was neg 5. The patient ultimately was able to tolerate a regular diet and remained medically stable for discharge 2. Anxiety  1. Stable 3. DVT prophylaxis  1. Was continued on Lovenox subQ  Discharge Exam: Filed Vitals:   06/03/13 0627 06/03/13 1431 06/03/13 2241 06/04/13 0500  BP: 115/71 127/90 121/69 117/74  Pulse: 60 57 50 53  Temp: 97.6 F (36.4 C) 98.2 F (36.8 C) 97.3 F (36.3 C) 97.4 F (36.3 C)  TempSrc: Oral Oral Oral Oral  Resp: 18 18 16 18   Height:      Weight:      SpO2: 100% 100% 95% 100%    General: Awake, in nad Cardiovascular: regular, s1, s2 Respiratory: normal resp effort, no wheezing  Discharge Instructions     Medication List         ciprofloxacin 500 MG tablet  Commonly known as:  CIPRO  Take 1  tablet (500 mg total) by mouth 2 (two) times daily.     ibuprofen 200 MG tablet  Commonly known as:  ADVIL,MOTRIN  Take 400 mg by mouth 4 (four) times daily as needed for pain.     loperamide 2 MG capsule  Commonly known as:  IMODIUM  Take 4 mg by mouth as needed for diarrhea or loose stools.     metroNIDAZOLE 500 MG tablet  Commonly known as:  FLAGYL  Take 1 tablet (500 mg total) by mouth 3 (three) times daily.     ondansetron 4 MG tablet  Commonly known as:  ZOFRAN  Take 1 tablet (4 mg total) by mouth every 6 (six) hours as needed for nausea.     oxyCODONE-acetaminophen 5-325 MG per tablet  Commonly known as:  PERCOCET  Take 2 tablets by mouth every 4 (four) hours as needed.     promethazine 25 MG tablet  Commonly known as:  PHENERGAN  Take 1 tablet (25 mg total) by mouth every 6 (six) hours as needed for nausea or vomiting.       Allergies  Allergen Reactions  . Nubain [Nalbuphine Hcl] Swelling   Follow-up Information   Schedule an appointment as soon as possible for a visit with Establish and follow up with PCP in 1-2 weeks.       The results of significant diagnostics from  this hospitalization (including imaging, microbiology, ancillary and laboratory) are listed below for reference.    Significant Diagnostic Studies: Dg Chest 2 View  06/02/2013   CLINICAL DATA:  Abdominal pain  EXAM: CHEST  2 VIEW  COMPARISON:  05/28/2013  FINDINGS: Normal heart size. Clear lungs. No pleural effusion. No pneumothorax. No acute bony deformity.  IMPRESSION: No active cardiopulmonary disease.   Electronically Signed   By: Maryclare Bean M.D.   On: 06/02/2013 16:49   US Abdomen Complete  05/28/2013   CLINICAL DATA:  Mid abdomen pain  EXAM: ULTRASOUND ABDOMEN COMPLETE  COMPARISON:  None.  FINDINGS: Gallbladder  Single gallstone identified within the gallbladder. No sonographic Murphy sign noted.  Common bile duct  Diameter: Mildly enlarged measuring 7 mm.  Liver  There is a 2.7 x 2.4 x 2.2  cm minimal complicated cyst in the right lobe liver. Within normal limits in parenchymal echogenicity.  IVC  No abnormality visualized.  Pancreas  Not well visualized due to overlying bowel gas.  Spleen  Size and appearance within normal limits.  Right Kidney  Length: 9.9 cm. Echogenicity within normal limits. No mass or hydronephrosis visualized.  Left Kidney  Length: 11.8 cm. Echogenicity within normal limits. No mass or hydronephrosis visualized.  Abdominal aorta  No aneurysm visualized.  IMPRESSION: Cholelithiasis without sonographic evidence of acute cholecystitis. Minimal enlarged common bile duct measuring 7 mm.   Electronically Signed   By: Sherian Rein M.D.   On: 05/28/2013 15:09   Ct Abdomen Pelvis W Contrast  06/02/2013   CLINICAL DATA:  Abdominal pain.  Nausea and vomiting.  EXAM: CT ABDOMEN AND PELVIS WITH CONTRAST  TECHNIQUE: Multidetector CT imaging of the abdomen and pelvis was performed using the standard protocol following bolus administration of intravenous contrast.  CONTRAST:  50mL OMNIPAQUE IOHEXOL 300 MG/ML SOLN, OMNIPAQUE IOHEXOL 300 MG/ML SOLN  COMPARISON:  CT 05/28/2013.  FINDINGS: No focal significant hepatic abnormality identified. Stable cyst posterior aspect of the liver. Spleen is normal. Gallstone again noted. No gallbladder wall thickening or pericholecystic fluid collections identified. Stable minimal prominence of the common bile duct noted. Common bile duct measures approximately 7-8 mm in diameter. The pancreas is normal. Splenic vein and portal vein are patent.  Adrenals normal. Stable simple cyst right kidney. Extrarenal pelvis on the right noted. No evidence of hydronephrosis or obstructing ureteral stone. Bladder is nondistended. Previously identified mild fullness in the of vulva and cervix again noted and direct visualization suggested. Phleboliths.  No significant adenopathy. Aorta normal caliber. Visceral vessels patent. Portal vein and splenic vein patent.   Appendectomy. Previously identified thickening along the terminal ileum no longer identified. There is no bowel distention. Minimal wall thickening of the sigmoid colon cannot be completely excluded. Similar finding in the left colon. Colitis including C difficile colitis cannot be excluded. Several slightly distended loops of small bowel are noted . Stool is present in the colon. The stomach is nondistended. These findings may relate to mild adynamic ileus. The pylorus and duodenum wall is mildly thickened. Duodenitis cannot be excluded. No free air noted.  Mild atelectasis lung bases. Heart size normal. Small umbilical hernia with herniation of fat only. No other significant hernia noted. No acute bony abnormality. Degenerative changes lumbar spine. Tiny bony densities noted in the spine or most likely tiny bone islands.  IMPRESSION: 1. Gallstone. No evidence of cholecystitis. Stable prominence of the common bile duct to 8 mm noted. No obstructing lesion or pancreatic lesion identified. 2. Previously identified  wall thickening in the region of terminal ileum is no longer identified on today's examination. The sigmoid and left colonic wall is mildly thickened. Mild changes of colitis including pseudomembranous colitis cannot be excluded. 3. Cannot exclude mild duodenitis. Cannot exclude mild adynamic ileus. 4. Again noted is mild fullness in the region of the vulva and cervix, direct visualization suggested.   Electronically Signed   By: Maisie Fus  Register   On: 06/02/2013 16:53   Ct Abdomen Pelvis W Contrast  05/28/2013   CLINICAL DATA:  Pain.  Bloody stool.  History of C difficile.  EXAM: CT ABDOMEN AND PELVIS WITH CONTRAST  TECHNIQUE: Multidetector CT imaging of the abdomen and pelvis was performed using the standard protocol following bolus administration of intravenous contrast.  CONTRAST:  50mL OMNIPAQUE IOHEXOL 300 MG/ML SOLN, OMNIPAQUE IOHEXOL 300 MG/ML SOLN  COMPARISON:  Ultrasound 05/28/2013.   FINDINGS: 2.6 cm simple cyst right lobe liver, image 21/series 2. Liver otherwise normal. Spleen normal. Pancreas normal. Gallstone. Gallbladder is nondistended. Common bile duct is mildly prominent at 8 mm. No obstructing abnormality.  Adrenals normal. 7 mm simple cyst right kidney extrarenal pelvis bilaterally. No hydronephrosis or obstructing ureteral stone. Bladder is unremarkable. Hysterectomy. Soft tissue prominence in the region of the cervix and vulva noted. Direct visualization to exclude cervical mass is suggested. No adnexal masses. No free pelvic fluid.  Shotty inguinal lymph nodes. Aorta normal caliber. Visceral vessels are patent.  Appendectomy. Mild wall thickening in the region of the terminal ileum noted. Ileitis cannot be excluded . No evidence of colitis. No evidence of bowel obstruction or free air.  Mild atelectasis lung bases. Heart size normal. Abdominal wall intact. No acute bony abnormality. Degenerative changes thoracic spine.  IMPRESSION: 1. Gallstone. Mild prominence of the common bile duct 8 mm noted. No obstructing biliary lesion or pancreatic lesion identified. 2. Mild wall thickening of the terminal ileum. Ileitis cannot be excluded. No evidence of colitis. 3. Soft tissue prominence in the region of the vulva and cervix. Direct visualization suggested to exclude a mass. Prior hysterectomy .   Electronically Signed   By: Maisie Fus  Register   On: 05/28/2013 17:51   Dg Abd Acute W/chest  05/28/2013   CLINICAL DATA:  Upper abdominal pain, nausea and vomiting.  EXAM: ACUTE ABDOMEN SERIES (ABDOMEN 2 VIEW & CHEST 1 VIEW)  COMPARISON:  PA and lateral chest 06/19/2012.  FINDINGS: Single view of the chest demonstrates clear lungs and normal heart size. No pneumothorax or pleural fluid.  Two views of the abdomen show no free intraperitoneal air. The bowel gas pattern is nonobstructive. No abnormal abdominal calcification is seen.  IMPRESSION: No acute finding chest or abdomen.   Electronically  Signed   By: Drusilla Kanner M.D.   On: 05/28/2013 13:57    Microbiology: Recent Results (from the past 240 hour(s))  URINE CULTURE     Status: None   Collection Time    05/28/13 11:45 AM      Result Value Range Status   Specimen Description URINE, CLEAN CATCH   Final   Special Requests NONE   Final   Culture  Setup Time     Final   Value: 05/28/2013 17:04     Performed at [facility] Count     Final   Value: 15,000 COLONIES/ML     Performed at [facility]   Culture     Final   Value: Multiple bacterial morphotypes present, none predominant. Suggest appropriate  recollection if clinically indicated.     Performed at [facility]   Report Status 05/29/2013 FINAL   Final  URINE CULTURE     Status: None   Collection Time    06/02/13  4:17 PM      Result Value Range Status   Specimen Description URINE, CLEAN CATCH   Final   Special Requests NONE   Final   Culture  Setup Time     Final   Value: 06/02/2013 19:35     Performed at [facility] Count     Final   Value: 35,000 COLONIES/ML     Performed at [facility]   Culture     Final   Value: Multiple bacterial morphotypes present, none predominant. Suggest appropriate recollection if clinically indicated.     Performed at [facility]   Report Status 06/03/2013 FINAL   Final  CLOSTRIDIUM DIFFICILE BY PCR     Status: None   Collection Time    06/03/13  1:59 PM      Result Value Range Status   C difficile by pcr NEGATIVE  NEGATIVE Final     Labs: Basic Metabolic Panel:  Recent Labs Lab 06/02/13 1446  NA 140  K 3.6  CL 104  CO2 25  GLUCOSE 105*  BUN 9  CREATININE 0.51  CALCIUM 9.9   Liver Function Tests:  Recent Labs Lab 06/02/13 1446  AST 28  ALT 28  ALKPHOS 68  BILITOT 0.4  PROT 8.0  ALBUMIN 4.1    Recent Labs Lab 06/02/13 1446  LIPASE 25   No results found for this basename: AMMONIA,  in the last 168 hours CBC:  Recent  Labs Lab 06/02/13 1446 06/04/13 0515  WBC 12.1* 7.8  NEUTROABS 9.9*  --   HGB 14.8 13.9  HCT 43.9 41.6  MCV 90.3 92.2  PLT 369 318   Cardiac Enzymes: No results found for this basename: CKTOTAL, CKMB, CKMBINDEX, TROPONINI,  in the last 168 hours BNP: BNP (last 3 results) No results found for this basename: PROBNP,  in the last 8760 hours CBG: No results found for this basename: GLUCAP,  in the last 168 hours  Signed:  CHIU, STEPHEN K  [facility] 06/04/2013, 3:38 PM

## 2013-06-04 NOTE — Progress Notes (Addendum)
Pt tried to eat a full liquid tray for lunch. Was able to eat the entirety of the tray without any nausea, but has had two loose bowel movements and has experienced a lot of cramping since lunch. Pt would like to try to advance diet as she feels that cramping and diarrhea may be due to the fact that she has only had liquids. Will continue to monitor, advance diet, and administer pain medication.

## 2013-06-04 NOTE — Progress Notes (Signed)
Patient states understanding of discharge instructions, prescriptions given 

## 2013-06-15 ENCOUNTER — Inpatient Hospital Stay (HOSPITAL_COMMUNITY)
Admission: EM | Admit: 2013-06-15 | Discharge: 2013-06-18 | DRG: 918 | Disposition: A | Discharge status: Other Acute Inpatient Hospital | Payer: Self-pay | Attending: Family Medicine | Admitting: Family Medicine

## 2013-06-15 ENCOUNTER — Observation Stay (HOSPITAL_COMMUNITY): Payer: Self-pay

## 2013-06-15 ENCOUNTER — Encounter (HOSPITAL_COMMUNITY): Payer: Self-pay | Admitting: Family Medicine

## 2013-06-15 DIAGNOSIS — T50901A Poisoning by unspecified drugs, medicaments and biological substances, accidental (unintentional), initial encounter: Secondary | ICD-10-CM

## 2013-06-15 DIAGNOSIS — E7220 Disorder of urea cycle metabolism, unspecified: Secondary | ICD-10-CM | POA: Diagnosis present

## 2013-06-15 DIAGNOSIS — F329 Major depressive disorder, single episode, unspecified: Secondary | ICD-10-CM

## 2013-06-15 DIAGNOSIS — T50992A Poisoning by other drugs, medicaments and biological substances, intentional self-harm, initial encounter: Secondary | ICD-10-CM | POA: Diagnosis present

## 2013-06-15 DIAGNOSIS — T1491XA Suicide attempt, initial encounter: Secondary | ICD-10-CM

## 2013-06-15 DIAGNOSIS — X838XXA Intentional self-harm by other specified means, initial encounter: Secondary | ICD-10-CM

## 2013-06-15 DIAGNOSIS — G8929 Other chronic pain: Secondary | ICD-10-CM

## 2013-06-15 DIAGNOSIS — R45851 Suicidal ideations: Secondary | ICD-10-CM

## 2013-06-15 DIAGNOSIS — R109 Unspecified abdominal pain: Secondary | ICD-10-CM

## 2013-06-15 DIAGNOSIS — F3289 Other specified depressive episodes: Secondary | ICD-10-CM | POA: Diagnosis present

## 2013-06-15 DIAGNOSIS — F32A Depression, unspecified: Secondary | ICD-10-CM

## 2013-06-15 DIAGNOSIS — F411 Generalized anxiety disorder: Secondary | ICD-10-CM | POA: Diagnosis present

## 2013-06-15 DIAGNOSIS — F17200 Nicotine dependence, unspecified, uncomplicated: Secondary | ICD-10-CM | POA: Diagnosis present

## 2013-06-15 DIAGNOSIS — T4275XA Adverse effect of unspecified antiepileptic and sedative-hypnotic drugs, initial encounter: Secondary | ICD-10-CM

## 2013-06-15 DIAGNOSIS — T426X1A Poisoning by other antiepileptic and sedative-hypnotic drugs, accidental (unintentional), initial encounter: Principal | ICD-10-CM

## 2013-06-15 DIAGNOSIS — H9319 Tinnitus, unspecified ear: Secondary | ICD-10-CM

## 2013-06-15 DIAGNOSIS — F419 Anxiety disorder, unspecified: Secondary | ICD-10-CM

## 2013-06-15 DIAGNOSIS — M549 Dorsalgia, unspecified: Secondary | ICD-10-CM | POA: Diagnosis present

## 2013-06-15 LAB — RAPID URINE DRUG SCREEN, HOSP PERFORMED
Amphetamines: NOT DETECTED
Barbiturates: NOT DETECTED
Opiates: NOT DETECTED
Tetrahydrocannabinol: NOT DETECTED

## 2013-06-15 LAB — BLOOD GAS, ARTERIAL
FIO2: 0.21 %
O2 Saturation: 94.9 %
Patient temperature: 37
pH, Arterial: 7.413 (ref 7.350–7.450)
pO2, Arterial: 76.4 mmHg — ABNORMAL LOW (ref 80.0–100.0)

## 2013-06-15 LAB — SALICYLATE LEVEL: Salicylate Lvl: <2 mg/dL — ABNORMAL LOW (ref 2.8–20.0)

## 2013-06-15 LAB — CBC WITH DIFFERENTIAL/PLATELET
Basophils Relative: 0 % (ref 0–1)
Eosinophils Absolute: 0.2 10*3/uL (ref 0.0–0.7)
Eosinophils Relative: 2 % (ref 0–5)
HCT: 45.3 % (ref 36.0–46.0)
Lymphs Abs: 2.6 10*3/uL (ref 0.7–4.0)
MCH: 31 pg (ref 26.0–34.0)
MCHC: 33.3 g/dL (ref 30.0–36.0)
MCV: 93 fL (ref 78.0–100.0)
Monocytes Absolute: 0.5 10*3/uL (ref 0.1–1.0)
Monocytes Relative: 6 % (ref 3–12)
Neutrophils Relative %: 65 % (ref 43–77)
Platelets: 319 10*3/uL (ref 150–400)
RBC: 4.87 MIL/uL (ref 3.87–5.11)

## 2013-06-15 LAB — COMPREHENSIVE METABOLIC PANEL
Albumin: 3.6 g/dL (ref 3.5–5.2)
Alkaline Phosphatase: 62 U/L (ref 39–117)
BUN: 9 mg/dL (ref 6–23)
CO2: 26 mEq/L (ref 19–32)
Potassium: 3.6 mEq/L (ref 3.5–5.1)
Sodium: 138 mEq/L (ref 135–145)
Total Protein: 7.8 g/dL (ref 6.0–8.3)

## 2013-06-15 LAB — URINALYSIS, ROUTINE W REFLEX MICROSCOPIC
Bilirubin Urine: NEGATIVE
Glucose, UA: NEGATIVE mg/dL
Hgb urine dipstick: NEGATIVE
Ketones, ur: NEGATIVE mg/dL
Protein, ur: NEGATIVE mg/dL

## 2013-06-15 LAB — MRSA PCR SCREENING: MRSA by PCR: POSITIVE — AB

## 2013-06-15 LAB — TROPONIN I: Troponin I: <0.3 ng/mL (ref <0.30)

## 2013-06-15 LAB — LACTIC ACID, PLASMA: Lactic Acid, Venous: 2 mmol/L (ref 0.5–2.2)

## 2013-06-15 LAB — PREGNANCY, URINE: Preg Test, Ur: NEGATIVE

## 2013-06-15 LAB — ETHANOL: Alcohol, Ethyl (B): <11 mg/dL (ref 0–11)

## 2013-06-15 LAB — ACETAMINOPHEN LEVEL: Acetaminophen (Tylenol), Serum: <15 ug/mL (ref 10–30)

## 2013-06-15 LAB — AMMONIA: Ammonia: 18 umol/L (ref 11–60)

## 2013-06-15 MED ORDER — ONDANSETRON HCL 4 MG/2ML IJ SOLN
4.0000 mg | Freq: Four times a day (QID) | INTRAMUSCULAR | Status: DC | PRN
  Administered 2013-06-15: 4 mg via INTRAVENOUS
  Filled 2013-06-15: qty 2

## 2013-06-15 MED ORDER — LORAZEPAM 2 MG/ML IJ SOLN
1.0000 mg | Freq: Once | INTRAMUSCULAR | Status: AC
  Administered 2013-06-15: 1 mg via INTRAVENOUS
  Filled 2013-06-15: qty 1

## 2013-06-15 MED ORDER — SODIUM CHLORIDE 0.9 % IV SOLN
INTRAVENOUS | Status: DC
  Administered 2013-06-15 – 2013-06-17 (×5): via INTRAVENOUS

## 2013-06-15 MED ORDER — SODIUM CHLORIDE 0.9 % IV SOLN
INTRAVENOUS | Status: AC
  Administered 2013-06-15: 15:00:00 via INTRAVENOUS

## 2013-06-15 MED ORDER — CHARCOAL ACTIVATED PO LIQD
ORAL | Status: AC
  Filled 2013-06-15: qty 240

## 2013-06-15 MED ORDER — ONDANSETRON HCL 4 MG PO TABS: 4.0000 mg | ORAL_TABLET | Freq: Four times a day (QID) | ORAL | Status: DC | PRN

## 2013-06-15 MED ORDER — LORAZEPAM 2 MG/ML IJ SOLN
1.0000 mg | INTRAMUSCULAR | Status: DC | PRN
  Administered 2013-06-16: 1 mg via INTRAVENOUS
  Filled 2013-06-15 (×2): qty 1

## 2013-06-15 MED ORDER — CHARCOAL ACTIVATED PO LIQD
50.0000 g | Freq: Once | ORAL | Status: AC
  Administered 2013-06-15: 50 g via ORAL
  Filled 2013-06-15: qty 240

## 2013-06-15 MED ORDER — CHARCOAL ACTIVATED PO LIQD
25.0000 g | Freq: Once | ORAL | Status: AC
  Administered 2013-06-15: 25 g via ORAL
  Filled 2013-06-15: qty 240

## 2013-06-15 MED ORDER — ACETAMINOPHEN 325 MG PO TABS
650.0000 mg | ORAL_TABLET | Freq: Once | ORAL | Status: AC
  Administered 2013-06-16: 650 mg via ORAL
  Filled 2013-06-15: qty 2

## 2013-06-15 MED ORDER — SODIUM CHLORIDE 0.9 % IJ SOLN
3.0000 mL | Freq: Two times a day (BID) | INTRAMUSCULAR | Status: DC
  Administered 2013-06-16 – 2013-06-17 (×4): 3 mL via INTRAVENOUS

## 2013-06-15 MED ORDER — CHARCOAL ACTIVATED PO LIQD
25.0000 g | Freq: Once | ORAL | Status: DC
  Filled 2013-06-15: qty 240

## 2013-06-15 MED ORDER — SODIUM CHLORIDE 0.9 % IV BOLUS (SEPSIS)
1000.0000 mL | Freq: Once | INTRAVENOUS | Status: AC
  Administered 2013-06-15: 1000 mL via INTRAVENOUS

## 2013-06-15 MED ORDER — MUPIROCIN 2 % EX OINT
1.0000 "application " | TOPICAL_OINTMENT | Freq: Two times a day (BID) | CUTANEOUS | Status: DC
  Administered 2013-06-15 – 2013-06-17 (×5): 1 via NASAL
  Filled 2013-06-15 (×2): qty 22

## 2013-06-15 MED ORDER — ONDANSETRON HCL 4 MG/2ML IJ SOLN
4.0000 mg | Freq: Once | INTRAMUSCULAR | Status: AC
  Administered 2013-06-15: 4 mg via INTRAVENOUS
  Filled 2013-06-15: qty 2

## 2013-06-15 MED ORDER — CHLORHEXIDINE GLUCONATE CLOTH 2 % EX PADS
6.0000 | MEDICATED_PAD | Freq: Every day | CUTANEOUS | Status: DC
  Administered 2013-06-17: 6 via TOPICAL

## 2013-06-15 MED ORDER — ONDANSETRON HCL 4 MG/2ML IJ SOLN: 4.0000 mg | Freq: Three times a day (TID) | INTRAMUSCULAR | Status: DC | PRN

## 2013-06-15 NOTE — H&P (Signed)
History and Physical  Sydney [name] ZOX:[id number] DOB: [birth year] DOA: 06/15/2013  Referring physician: Dr. Manus Gunning in ED PCP: No PCP Per Patient confirmed  Chief Complaint: Depakote overdose  HPI:  52 year old woman who presented to the emergency department with a history of intentional suicide attempt with Depakote overdose.  History obtained from patient bedside. She reports recently being noted for abdominal pain, she was diagnosed with colitis and sent home on antibiotics. Has continued to have abdominal pain without relief and has become very depressed. She has had persistent nausea and vomiting with intermittent diarrhea over the last few weeks. Today because she was so depressed and wanted to stop the pain she intentionally ingested a large amount of her roommates Depakote 500 mg extended release tablets. Exact amount unknown but probably more than 30. She denies taking any other medications. No Tylenol or salicylates.  In the emergency department she was noted be afebrile with stable vital signs. ABG, EKG, complete metabolic panel unremarkable. Ammonia level was normal. Valproic acid level was greater than 150. Tylenol and salicylate level negative. Urine pregnancy negative. Urinalysis negative. EKG not acute.  Review of Systems:  Negative for fever, visual changes, sore throat, rash, new muscle aches, chest pain, SOB, dysuria, bleeding,   Past Medical History  Diagnosis Date  . Depression   . C. difficile colitis   . Opiate dependence     detox 06/2012  . Anxiety   . Chronic back pain   . Sciatic pain     Past Surgical History  Procedure Laterality Date  . Abdominal hysterectomy    . Appendectomy    . Left oophorectomy      Social History:  reports that she has been smoking Cigarettes.  She has been smoking about 0.00 packs per day. She does not have any smokeless tobacco history on file. She reports that she does not drink alcohol or use illicit drugs.  Allergies   Allergen Reactions  . Nubain [Nalbuphine Hcl] Swelling    Family History  Problem Relation Age of Onset  . Adopted: Yes  . Family history unknown: Yes     Prior to Admission medications   Medication Sig Start Date End Date Taking? Authorizing Provider  ciprofloxacin (CIPRO) 500 MG tablet Take 1 tablet (500 mg total) by mouth 2 (two) times daily. 06/04/13  Yes Jerald Kief, MD  ibuprofen (ADVIL,MOTRIN) 200 MG tablet Take 400 mg by mouth 4 (four) times daily as needed for pain.   Yes Historical Provider, MD  metroNIDAZOLE (FLAGYL) 500 MG tablet Take 1 tablet (500 mg total) by mouth 3 (three) times daily. 06/03/13  Yes Jerald Kief, MD   Physical Exam: Filed Vitals:   06/15/13 1323  BP: 142/109  Pulse: 78  Resp: 20  SpO2: 98%   General: Examined in the emergency department. Appears calm and mildly uncomfortable. Nontoxic. Psychiatric: Fully alert and awake. Follows commands. Gives a good history, speaks normally. Appears depressed. Eyes: PERRL, normal lids, irises  ENT: Seems to have some modest difficulty hearing, normal lips and tongue Neck: no LAD, masses or thyromegaly Cardiovascular: RRR, no m/r/g. No LE edema. Respiratory: CTA bilaterally, no w/r/r. Normal respiratory effort. Abdomen: soft, nondistended, mild upper, epigastric abdominal pain, positive bowel sounds Skin: no rash or induration seen  Musculoskeletal: grossly normal tone BUE/BLE Neurologic: grossly non-focal.  Wt Readings from Last 3 Encounters:  06/02/13 66.543 kg (146 lb 11.2 oz)  05/28/13 68.04 kg (150 lb)  04/28/13 68.04 kg (150 lb)  Labs on Admission:  Basic Metabolic Panel:  Recent Labs Lab 06/15/13 1312  NA 138  K 3.6  CL 99  CO2 26  GLUCOSE 95  BUN 9  CREATININE 0.62  CALCIUM 9.4    Liver Function Tests:  Recent Labs Lab 06/15/13 1312  AST 26  ALT 30  ALKPHOS 62  BILITOT 0.2*  PROT 7.8  ALBUMIN 3.6   Recent Labs Lab 06/15/13 1312  AMMONIA 18    CBC:  Recent  Labs Lab 06/15/13 1312  WBC 9.2  NEUTROABS 5.9  HGB 15.1*  HCT 45.3  MCV 93.0  PLT 319    Cardiac Enzymes:  Recent Labs Lab 06/15/13 1312  TROPONINI <0.30    CBG:  Recent Labs Lab 06/15/13 1308  GLUCAP 81   EKG: Independently reviewed. Normal sinus rhythm, poor quality, no acute changes seen.   Principal Problem:   Valproic acid toxicity Active Problems:   Suicide attempt   Tinnitus   Abdominal pain   Chronic back pain   Depression   Anxiety   Assessment/Plan 1. Depakote toxicity secondary to intentional overdose: Treated with charcoal in the emergency department. 2. Suicide attempt/intentional overdose 3. Tinnitus: Secondary to Depakote toxicity  4. Subacute abdominal pain, present for the last 2 or more weeks: Etiology unclear. Predominantly epigastric. Recent CT demonstrated colitis, duodenitis, possible mild adynamic ileus  5. Chronic back pain 6. Depression, anxiety 7. Abnormal appearance of the vulva and cervix on CT: Followup with GYN as an outpatient   Recommendations per poison control:   Cardiac monitoring  IV hydration, follow urine output  Depakote levels every 6 hours until normal   Ammonia level of neuro status declines.   EKG every 6 hours.   Repeat charcoal 25 gm in 3 hours if awake/protecting airway and has positive bowel sounds.   Monitor for seizures. Give benzodiazepines or phenobarbital for seizures.  Do not recommend l-carnitine unless patient's mental status declines or ammonia becomes elevated.  CBC, CMP, ammonia level, lipase in AM   Abdominal plain film  Suicide precautions  Psychiatry consultation once medically clear  Code Status: full code  DVT prophylaxis:SCDs Family Communication: none  Disposition Plan/Anticipated LOS: obs, 1-2 days  Time spent: 55 minutes  [name], MD  [facility] Pager [phone] 06/15/2013, 3:13 PM

## 2013-06-15 NOTE — ED Provider Notes (Signed)
CSN: [id number]     Arrival date & time 06/15/13  1254 History  This chart was scribed for Glynn Octave, MD by Luisa Dago, ED Scribe and Bennett Scrape, ED Scribe. This patient was seen in room APA01/APA01 and the patient's care was started at 1:02 PM.    Chief Complaint  Patient presents with  . Drug Overdose   Level 5 Caveat- Altered Mental Status from overdose  The history is provided by the patient. The history is limited by the condition of the patient. No language interpreter was used.   HPI Comments: Sydney [name] is a 52 y.o. female who presents to the Emergency Department complaining of drug overdose from Depakote that she took 2 hours ago. Pt states that she took about 60 Depakote pills that she got from someone else's prescription. She admits that this was a suicide attempt. She says that she has been depressed and just wanted to go to sleep due to ongoing abdominal pain that has been present for several weeks, usually accompanied by emesis. However, she called EMS after rethinking what she did.  She reports that she has been evaluated for the same in the past with no diagnosis. Pt states that she has back pain that is relieved by laying supine. Pt denies having a headache, chest pain. She denies having a h/o seizures.  Past Medical History  Diagnosis Date  . Depression   . C. difficile colitis   . Opiate dependence     detox 06/2012  . Anxiety   . Chronic back pain   . Sciatic pain    Past Surgical History  Procedure Laterality Date  . Abdominal hysterectomy    . Appendectomy    . Left oophorectomy     No family history on file. History  Substance Use Topics  . Smoking status: Current Every Day Smoker    Types: Cigarettes  . Smokeless tobacco: Not on file  . Alcohol Use: No   No OB history provided.  Review of Systems  Unable to perform ROS: Mental status change    Allergies  Nubain  Home Medications   Current Outpatient Rx  Name  Route  Sig   Dispense  Refill  . ciprofloxacin (CIPRO) 500 MG tablet   Oral   Take 1 tablet (500 mg total) by mouth 2 (two) times daily.   18 tablet   0   . ibuprofen (ADVIL,MOTRIN) 200 MG tablet   Oral   Take 400 mg by mouth 4 (four) times daily as needed for pain.         . metroNIDAZOLE (FLAGYL) 500 MG tablet   Oral   Take 1 tablet (500 mg total) by mouth 3 (three) times daily.   27 tablet   0    Triage Vitals: BP 142/109  Pulse 78  Resp 20  SpO2 98%  Physical Exam  Nursing note and vitals reviewed. Constitutional: She appears well-developed and well-nourished. No distress.  Pt is somnolent but arousable. She is protecting her airway  HENT:  Head: Normocephalic and atraumatic.  Eyes: EOM are normal.  Neck: Neck supple. No tracheal deviation present.  Cardiovascular: Normal rate and regular rhythm.   Pulmonary/Chest: Effort normal and breath sounds normal. No respiratory distress.  Abdominal: There is tenderness (diffuse).  No peritoneal signs  Musculoskeletal: Normal range of motion.  Neurological:  Generally weak with poor effort but she is able to move all extremities.   Skin: Skin is warm and dry.  Psychiatric: She  has a normal mood and affect. Her behavior is normal.    ED Course  Procedures (including critical care time)  DIAGNOSTIC STUDIES: Oxygen Saturation is 98% on room air, normal by my interpretation.    COORDINATION OF CARE: 1:04 PM-Discussed treatment plan which includes  with pt at bedside and pt agreed to plan.   2:22 PM-Discussed Depakote levels and told pt that she must be admitted overnight for observation. Pt agrees with treatment plan.  Labs Review Labs Reviewed  CBC WITH DIFFERENTIAL - Abnormal; Notable for the following:    Hemoglobin 15.1 (*)    All other components within normal limits  COMPREHENSIVE METABOLIC PANEL - Abnormal; Notable for the following:    Total Bilirubin 0.2 (*)    All other components within normal limits  VALPROIC ACID  LEVEL - Abnormal; Notable for the following:    Valproic Acid Lvl >150.0 (*)    All other components within normal limits  SALICYLATE LEVEL - Abnormal; Notable for the following:    Salicylate Lvl <2.0 (*)    All other components within normal limits  BLOOD GAS, ARTERIAL - Abnormal; Notable for the following:    pO2, Arterial 76.4 (*)    Bicarbonate 24.3 (*)    All other components within normal limits  TROPONIN I  AMMONIA  ETHANOL  ACETAMINOPHEN LEVEL  PREGNANCY, URINE  URINALYSIS, ROUTINE W REFLEX MICROSCOPIC  URINE RAPID DRUG SCREEN (HOSP PERFORMED)  LACTIC ACID, PLASMA  GLUCOSE, CAPILLARY   Imaging Review No results found.  EKG Interpretation    Date/Time:  Saturday June 15 2013 12:58:54 EST Ventricular Rate:  74 PR Interval:  160 QRS Duration: 86 QT Interval:  420 QTC Calculation: 466 R Axis:   55 Text Interpretation:  Normal sinus rhythm Normal ECG No previous ECGs available Artifact No previous ECGs available Confirmed by Masiel Gentzler  MD, Almond Fitzgibbon ([id number]) on 06/15/2013 1:30:38 PM            MDM   1. Valproic acid toxicity, initial encounter   2. Suicidal ideation   3. Drug overdose, initial encounter    Intentional overdose of unknown amount of depakote 1.5-2 hours ago.  Stable chronic abdominal pain with recent admission for colitis, C dif negative.  Admits to SI.    The patient is alert and protecting airway. Patient given PO charcoal. Depakote level greater than 150. Ammonia is 18. LFTs normal. APAP negative.  Discussed with poison center. They did not recommend l-carnitine unless patient's mental status declines or ammonia becomes elevated. Patient needs admission for cardiac monitoring, monitoring for seizures and serial Depakote levels.  Vitals and mental status remained stable in the ED.    CRITICAL CARE Performed by: Glynn Octave Total critical care time: 30 Critical care time was exclusive of separately billable procedures and treating  other patients. Critical care was necessary to treat or prevent imminent or life-threatening deterioration. Critical care was time spent personally by me on the following activities: development of treatment plan with patient and/or surrogate as well as nursing, discussions with consultants, evaluation of patient's response to treatment, examination of patient, obtaining history from patient or surrogate, ordering and performing treatments and interventions, ordering and review of laboratory studies, ordering and review of radiographic studies, pulse oximetry and re-evaluation of patient's condition.   I personally performed the services described in this documentation, which was scribed in my presence. The recorded information has been reviewed and is accurate.     Glynn Octave, MD 06/15/13 1556

## 2013-06-15 NOTE — ED Notes (Signed)
Poison control recommendations.  Cardiac monitoring,  depakote levels q6h until normal.  Check ammonia if neuro status declines.   Check tylenol level.  Give activated charcoal.  Repeat ekg in 6-8 hours because depakote may cause conduction delay.  Pt at high risk to have a seizure.  If she does seize,  Give benzos or phenobarbital.

## 2013-06-15 NOTE — Progress Notes (Signed)
Pt refusing dose of activated charcoal d/t it causing her "stomach to hurt". I stressed the importance of the medication as well as the extended release effects of the medication she ingested, but the patient still refused. Mid-level notified, and is aware of the patient's refusal. Will monitor pt and future lab work.

## 2013-06-15 NOTE — ED Notes (Addendum)
Pt states she was depressed and took her room mates medications. depakote 500mg  tablets.  Approximately took 60 tablets.  cbg- 83.  Pt states she was here 2 weeks ago with abdominal pain and her stomach is still hurting.

## 2013-06-15 NOTE — ED Notes (Signed)
Notifying poison control

## 2013-06-15 NOTE — ED Notes (Signed)
139/69 ems b/p

## 2013-06-15 NOTE — Progress Notes (Signed)
Inventory of patient belongings placed in chart. Glasses in patient room; cell phone, necklace, $1 cash, and credit card sent to security. All other belonging secured at nurses desk.

## 2013-06-16 ENCOUNTER — Encounter (HOSPITAL_COMMUNITY): Payer: Self-pay | Admitting: General Practice

## 2013-06-16 LAB — VALPROIC ACID LEVEL
Valproic Acid Lvl: 488.3 ug/mL (ref 50.0–100.0)
Valproic Acid Lvl: 141.3 ug/mL — ABNORMAL HIGH (ref 50.0–100.0)
Valproic Acid Lvl: 112.8 ug/mL — ABNORMAL HIGH (ref 50.0–100.0)

## 2013-06-16 LAB — COMPREHENSIVE METABOLIC PANEL
ALT: 22 U/L (ref 0–35)
AST: 21 U/L (ref 0–37)
AST: 21 U/L (ref 0–37)
Albumin: 2.9 g/dL — ABNORMAL LOW (ref 3.5–5.2)
Albumin: 3 g/dL — ABNORMAL LOW (ref 3.5–5.2)
Alkaline Phosphatase: 52 U/L (ref 39–117)
BUN: 4 mg/dL — ABNORMAL LOW (ref 6–23)
CO2: 24 mEq/L (ref 19–32)
CO2: 26 mEq/L (ref 19–32)
Calcium: 7.7 mg/dL — ABNORMAL LOW (ref 8.4–10.5)
Calcium: 8.1 mg/dL — ABNORMAL LOW (ref 8.4–10.5)
Chloride: 107 mEq/L (ref 96–112)
Chloride: 107 mEq/L (ref 96–112)
Creatinine, Ser: 0.61 mg/dL (ref 0.50–1.10)
GFR calc non Af Amer: >90 mL/min (ref >90)
GFR calc non Af Amer: >90 mL/min (ref >90)
Glucose, Bld: 98 mg/dL (ref 70–99)
Sodium: 143 mEq/L (ref 135–145)

## 2013-06-16 LAB — AMMONIA
Ammonia: 187 umol/L — ABNORMAL HIGH (ref 11–60)
Ammonia: 109 umol/L — ABNORMAL HIGH (ref 11–60)

## 2013-06-16 MED ORDER — LACTULOSE ENEMA
300.0000 mL | Freq: Once | ORAL | Status: DC
  Filled 2013-06-16: qty 300

## 2013-06-16 MED ORDER — LACTULOSE 10 GM/15ML PO SOLN
30.0000 g | Freq: Once | ORAL | Status: AC
  Administered 2013-06-16: 30 g via ORAL
  Filled 2013-06-16: qty 60

## 2013-06-16 MED ORDER — OXYCODONE HCL 5 MG PO TABS
5.0000 mg | ORAL_TABLET | ORAL | Status: DC | PRN
  Administered 2013-06-16 – 2013-06-18 (×9): 5 mg via ORAL
  Filled 2013-06-16 (×9): qty 1

## 2013-06-16 MED ORDER — LORAZEPAM 2 MG/ML IJ SOLN
0.5000 mg | INTRAMUSCULAR | Status: DC | PRN
  Administered 2013-06-16 – 2013-06-17 (×2): 0.5 mg via INTRAVENOUS
  Filled 2013-06-16: qty 1

## 2013-06-16 NOTE — Progress Notes (Signed)
Utilization Review completed.  

## 2013-06-16 NOTE — Progress Notes (Signed)
[facility] PROGRESS NOTE  Sydney [name] ZOX:[id number] DOB: 06/04/1961 DOA: 06/15/2013 PCP: No PCP Per Patient  Assessment/Plan: 1. Depakote toxicity secondary to intentional overdose: No apparent clinical sequela. Depakote level as she is trending downwards. Ammonia level markedly elevated, now trending down. Treated with charcoal in the emergency department. Catheter slight for ammonia level as per poison control. EKG without acute changes, mental status normal. 2. Hyperammonemia: Secondary to Depakote toxicity. Lactulose. 3. Suicide attempt/intentional overdose 4. Tinnitus secondary to Depakote toxicity, resolved 5. Subacute abdominal pain, present for 2 more weeks: Etiology unclear. Recent CT suggests a colitis, duodenitis. Abdominal film unremarkable. Patient wants to eat. Further evaluation as an outpatient. 6. Chronic back pain 7. Depression, anxiety 8. Abnormal appearance of the vulva and cervix on CT   Discussed with poison control:  Continue cardiac monitoring  Check Depakote level, ammonia level, repeat labs in the morning.  EKG in the morning  no indication for further charcoal or L-carnitine  Continue suicide precautions  Anticipate medical clearance in the morning, psychiatry consultation, anticipate inpatient psychiatric treatment  Will arrange outpatient followup with GYN prior to discharge  Pending studies:     Code Status: full code DVT prophylaxis: SCDs Family Communication: none Disposition Plan: pending further evaluation,   [name], MD  [facility]  Pager [phone] If 7PM-7AM, please contact night-coverage at [email or web address], password [facility] 06/16/2013, 9:53 AM  LOS: 1 day   Summary: 52 year old woman who presented to the emergency department with a history of intentional suicide attempt with Depakote overdose.  Consultants:  Poison Control  Procedures:    Antibiotics:    HPI/Subjective: Ammonia level elevated  overnight however no confusion. Patient complains of chronic abdominal pain without significant change. No new issues. No confusion. Patient requests diet. Tinnitus has resolved.  Objective: Filed Vitals:   06/16/13 0745 06/16/13 0800 06/16/13 0815 06/16/13 0830  BP: 113/68 106/34 102/22 108/67  Pulse: 89 69 75 74  Temp:      TempSrc:      Resp: 26 14 14 17   Height:      Weight:      SpO2: 97% 97% 97% 99%    Intake/Output Summary (Last 24 hours) at 06/16/13 0953 Last data filed at 06/16/13 0800  Gross per 24 hour  Intake   2320 ml  Output   1100 ml  Net   1220 ml     Filed Weights   06/15/13 1546 06/15/13 1940 06/16/13 0500  Weight: 68.2 kg (150 lb 5.7 oz) 68.2 kg (150 lb 5.7 oz) 70.8 kg (156 lb 1.4 oz)    Exam:   Afebrile, vital signs stable  General: Appears calm and comfortable. Speech fluent and clear.  Cardiovascular: Regular rate and rhythm. No murmur, rub or gallop. No lower extremity edema.  Respiratory: Clear to auscultation bilaterally. No wheezes, rales or rhonchi. Normal respiratory effort.  Abdomen: Positive bowel sounds. Soft, nontender, nondistended.  Skin: Appears unremarkable.  Psychiatric: Appears depressed. Speech fluent and clear. Alert, oriented to self, location, month, year, reason for hospitalization.  Data Reviewed:  VPA level 141.3 >> 112.8  Ammonia 187 >> 109  LFTs within normal limits  Basic metabolic panel unremarkable  Abdominal film unremarkable  EKG x2: Normal sinus rhythm, no acute changes  Scheduled Meds: . charcoal activated (NO SORBITOL)  25 g Oral Once  . Chlorhexidine Gluconate Cloth  6 each Topical Q0600  . lactulose  300 mL Rectal Once  . mupirocin ointment  1 application Nasal BID  .  sodium chloride  3 mL Intravenous Q12H   Continuous Infusions: . sodium chloride 150 mL/hr at 06/16/13 0800    Principal Problem:   Valproic acid toxicity Active Problems:   Suicide attempt   Tinnitus   Abdominal pain    Chronic back pain   Depression   Anxiety   Time spent 20 minutes

## 2013-06-16 NOTE — Progress Notes (Signed)
Patient transferred to room 312, suicide sitter at bedside. Report given to Lindaann Slough RN. Vital signs stable at transfer.

## 2013-06-17 LAB — COMPREHENSIVE METABOLIC PANEL
ALT: 18 U/L (ref 0–35)
Alkaline Phosphatase: 47 U/L (ref 39–117)
BUN: 7 mg/dL (ref 6–23)
CO2: 26 mEq/L (ref 19–32)
Chloride: 108 mEq/L (ref 96–112)
GFR calc Af Amer: >90 mL/min (ref >90)
GFR calc non Af Amer: >90 mL/min (ref >90)
Glucose, Bld: 89 mg/dL (ref 70–99)
Potassium: 3.1 mEq/L — ABNORMAL LOW (ref 3.5–5.1)
Sodium: 142 mEq/L (ref 135–145)
Total Bilirubin: 0.5 mg/dL (ref 0.3–1.2)

## 2013-06-17 LAB — CBC
Hemoglobin: 11.4 g/dL — ABNORMAL LOW (ref 12.0–15.0)
MCH: 31.1 pg (ref 26.0–34.0)
MCV: 93.4 fL (ref 78.0–100.0)
RBC: 3.66 MIL/uL — ABNORMAL LOW (ref 3.87–5.11)

## 2013-06-17 LAB — AMMONIA: Ammonia: 54 umol/L (ref 11–60)

## 2013-06-17 MED ORDER — POTASSIUM CHLORIDE CRYS ER 20 MEQ PO TBCR
40.0000 meq | EXTENDED_RELEASE_TABLET | Freq: Once | ORAL | Status: AC
  Administered 2013-06-17: 40 meq via ORAL
  Filled 2013-06-17: qty 2

## 2013-06-17 MED ORDER — LORAZEPAM 0.5 MG PO TABS
0.5000 mg | ORAL_TABLET | Freq: Four times a day (QID) | ORAL | Status: DC | PRN
  Administered 2013-06-17 – 2013-06-18 (×3): 0.5 mg via ORAL
  Filled 2013-06-17 (×3): qty 1

## 2013-06-17 MED ORDER — ENSURE COMPLETE PO LIQD
237.0000 mL | Freq: Two times a day (BID) | ORAL | Status: DC
  Administered 2013-06-17: 237 mL via ORAL

## 2013-06-17 NOTE — Progress Notes (Signed)
Per Joni Reining of [facility] Pt was decline due to insurance carrier being out of the network.

## 2013-06-17 NOTE — Progress Notes (Signed)
Writer consulted with NP, Nanine Means) regarding the patient meeting criteria for inpatient hospitalization.  Writer informed the [facility] Areola).  The [facility] reports that there are no beds at [facility] at present.    TTS and [facility] Tech will refer the patient to other hospitals for placement.    Writer informed the nurse of the the patient disposition.   The nurse reports that she will inform Dr. Irene Limbo of the patients placement status.

## 2013-06-17 NOTE — Discharge Summary (Signed)
Physician Discharge Summary  Sydney [name] WGN:[id number] DOB: [birth year] DOA: 06/15/2013  PCP: No PCP Per Patient  Admit date: 06/15/2013 Discharge date: 06/17/2013  Recommendations for Outpatient Follow-up:  1. Suicide attempt, drug overdose 2. Chronic abdominal pain 3. Depression, anxiety 4. Abnormal appearance of the vulva and cervix on CT  Follow-up Information   Follow up with Tilda Burrow, MD On 07/19/2013. (8:30 AM)    Specialties:  Obstetrics and Gynecology, Radiology   Contact information:   [address] [place] [address] [phone]      Discharge Diagnoses:  1. Depakote toxicity 2. Intentional overdose, suicide attempt 3. Hyperammonemia 4. Chronic abdominal pain 5. Depression, anxiety  Discharge Condition: Improved, medically stable Disposition: Awaiting bed at inpatient psychiatric facility  Diet recommendation: Regular  Filed Weights   06/15/13 1546 06/15/13 1940 06/16/13 0500  Weight: 68.2 kg (150 lb 5.7 oz) 68.2 kg (150 lb 5.7 oz) 70.8 kg (156 lb 1.4 oz)    History of present illness:  52 year old woman who presented to the emergency department with a history of intentional suicide attempt with Depakote overdose.  Hospital Course:  Sydney [name] was admitted for further evaluation and treatment of intentional overdose with Depakote, Depakote toxicity. She developed hyperammonemia which was successfully treated with lactulose. She was to be with activated charcoal with good response. Her laboratory studies have normalized, per poison control no further followup is recommended. No obvious clinical sequela. She was evaluated by behavioral health psychiatry and inpatient psychiatric treatment was recommended but no beds currently available. She is medically stable for transfer to an inpatient psychiatric facility when bed is available. See individual issues as below.  1. Depakote toxicity secondary to intentional overdose: No apparent clinical  sequela. Depakote level now therapeutic and expected to spontaneously resolve. Ammonia level to normal. Treated with charcoal in the emergency department. Kayexalate for ammonia level as per poison control. EKG without acute changes, mental status normal. 2. Hyperammonemia: Secondary to Depakote toxicity. Resolved. 3. Suicide attempt/intentional overdose 4. Tinnitus secondary to Depakote toxicity, resolved 5. Subacute abdominal pain, present for 2 more weeks: Etiology unclear. Recent CT suggests a colitis, duodenitis. Abdominal film unremarkable. Appears resolved. Followup as an outpatient. 6. Chronic back pain, stable 7. Depression, anxiety; stable 8. Abnormal appearance of the vulva and cervix on CT Continue suicide precautions  Medically clear  Psychiatry consultation has recommended inpatient treatment but no beds at [facility]  Outpatient followup with GYN 07/19/13 at 8:30 AM with [facility]  May transfer to inpatient psychiatric facility when bed available  Discharge Instructions   Future Appointments Provider Department Dept Phone   07/19/2013 8:30 AM Tilda Burrow, MD [facility] [phone]       Medication List    STOP taking these medications       ciprofloxacin 500 MG tablet  Commonly known as:  CIPRO     ibuprofen 200 MG tablet  Commonly known as:  ADVIL,MOTRIN     metroNIDAZOLE 500 MG tablet  Commonly known as:  FLAGYL       Allergies  Allergen Reactions  . Nubain [Nalbuphine Hcl] Swelling    The results of significant diagnostics from this hospitalization (including imaging, microbiology, ancillary and laboratory) are listed below for reference.    Significant Diagnostic Studies: Dg Abd Portable 1v  06/15/2013   CLINICAL DATA:  Epigastric pain  EXAM: PORTABLE ABDOMEN - 1 VIEW  COMPARISON:  06/02/2013  FINDINGS: Scattered large and small bowel gas is noted. A moderate  amount of retained fecal material is seen. No free air is noted. No abnormal mass  or abnormal calcifications are seen.  IMPRESSION: No acute abnormality noted.   Electronically Signed   By: Alcide Clever M.D.   On: 06/15/2013 16:05    Microbiology: Recent Results (from the past 240 hour(s))  MRSA PCR SCREENING     Status: Abnormal   Collection Time    06/15/13  3:40 PM      Result Value Range Status   MRSA by PCR POSITIVE (*) NEGATIVE Final   Comment:            The GeneXpert MRSA Assay (FDA     approved for NASAL specimens     only), is one component of a     comprehensive MRSA colonization     surveillance program. It is not     intended to diagnose MRSA     infection nor to guide or     monitor treatment for     MRSA infections.     RESULT CALLED TO, READ BACK BY AND VERIFIED WITH:     T. NEILSON AT 1949 ON 06/15/13 BY Wynonia Lawman     Labs: Basic Metabolic Panel:  Recent Labs Lab 06/15/13 1312 06/16/13 0106 06/16/13 0701 06/17/13 0509  NA 138 141 143 142  K 3.6 3.6 3.6 3.1*  CL 99 107 107 108  CO2 26 24 26 26   GLUCOSE 95 98 97 89  BUN 9 4* 5* 7  CREATININE 0.62 0.61 0.65 0.61  CALCIUM 9.4 7.7* 8.1* 8.7   Liver Function Tests:  Recent Labs Lab 06/15/13 1312 06/16/13 0106 06/16/13 0701 06/17/13 0509  AST 26 21 21 18   ALT 30 21 22 18   ALKPHOS 62 49 52 47  BILITOT 0.2* 0.4 0.7 0.5  PROT 7.8 6.2 6.5 5.9*  ALBUMIN 3.6 2.9* 3.0* 2.8*    Recent Labs Lab 06/16/13 0701  LIPASE 38    Recent Labs Lab 06/15/13 1312 06/16/13 0106 06/16/13 0701 06/17/13 0509  AMMONIA 18 187* 109* 54   CBC:  Recent Labs Lab 06/15/13 1312 06/17/13 0509  WBC 9.2 6.9  NEUTROABS 5.9  --   HGB 15.1* 11.4*  HCT 45.3 34.2*  MCV 93.0 93.4  PLT 319 210   Cardiac Enzymes:  Recent Labs Lab 06/15/13 1312  TROPONINI <0.30   CBG:  Recent Labs Lab 06/15/13 1308  GLUCAP 81    Principal Problem:   Valproic acid toxicity Active Problems:   Suicide attempt   Tinnitus   Abdominal pain   Chronic back pain   Depression   Anxiety   Time  coordinating discharge: 35 minutes  Signed:  [name], MD [facility] 06/17/2013, 4:43 PM
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
## 2013-06-17 NOTE — Progress Notes (Signed)
Writer contacted the nurse working with patient regarding a 11am Tele Assessment.

## 2013-06-17 NOTE — Progress Notes (Signed)
MD informed that there are not any beds available at [facility] at this time and the patient is awaiting placement.

## 2013-06-17 NOTE — Progress Notes (Signed)
The following facilities have been faxed a referral packet for possible placement 06-17-13:  Rennie Plowman [facility]

## 2013-06-17 NOTE — Progress Notes (Signed)
Writer spoke to the MHT Tech located at [facility] and she will fax out referrals to other hospitals for placement.

## 2013-06-17 NOTE — Progress Notes (Addendum)
Nutrition Brief Note  Patient identified on the Malnutrition Screening Tool (MST) Report  Pt presented to ED with intentional overdose (depakote). Hx of depression. [facility]  Wt Readings from Last 15 Encounters:  06/16/13 156 lb 1.4 oz (70.8 kg)  06/02/13 146 lb 11.2 oz (66.543 kg)  05/28/13 150 lb (68.04 kg)  04/28/13 150 lb (68.04 kg)  06/20/12 156 lb (70.761 kg)  06/19/12 170 lb (77.111 kg)  03/18/12 160 lb (72.576 kg)  02/18/12 160 lb (72.576 kg)    Body mass index is 23.74 kg/(m^2). Patient meets criteria for normal based on current BMI.   Current diet order is Regular and patient is consuming approximately 75-100% of meals at this time. Appetite is good. Labs and medications reviewed. No nutrition interventions warranted at this time.   Royann Shivers MS,RD,CSG,LDN Office: [phone] Pager: [phone]

## 2013-06-17 NOTE — Progress Notes (Signed)
[facility] PROGRESS NOTE  Sydney [name] AVW:[id number] DOB: 10-30-1960 DOA: 06/15/2013 PCP: No PCP Per Patient  Assessment/Plan: 1. Depakote toxicity secondary to intentional overdose: No apparent clinical sequela. Depakote level now therapeutic and expected to spontaneously resolve. Ammonia level to normal. Treated with charcoal in the emergency department. Kayexalate for ammonia level as per poison control. EKG without acute changes, mental status normal. 2. Hyperammonemia: Secondary to Depakote toxicity.  Resolved. 3. Suicide attempt/intentional overdose 4. Tinnitus secondary to Depakote toxicity, resolved 5. Subacute abdominal pain, present for 2 more weeks: Etiology unclear. Recent CT suggests a colitis, duodenitis. Abdominal film unremarkable. Appears resolved. Followup as an outpatient. 6. Chronic back pain, stable 7. Depression, anxiety; stable 8. Abnormal appearance of the vulva and cervix on CT   Replete potassium  Discussed with poison control: No further recommendations, no further monitoring. Case closed.  Continue suicide precautions  Medically clear  Psychiatry consultation has recommended inpatient treatment but no beds at [facility]  Outpatient followup with GYN 07/19/13 at 8:30 AM with [facility]  May transfer to inpatient psychiatric facility when bed available  Pending studies:     Code Status: full code DVT prophylaxis: SCDs Family Communication: none Disposition Plan: pending further evaluation,   [name], MD  [facility]  Pager [phone] If 7PM-7AM, please contact night-coverage at [email or web address], password [facility] 06/17/2013, 10:50 AM  LOS: 2 days   Summary: 52 year old woman who presented to the emergency department with a history of intentional suicide attempt with Depakote overdose.  Consultants:  Poison Control  Procedures:    Antibiotics:    HPI/Subjective: No issues noted overnight. Feels much better today.  Eating better. Pain well-controlled.  Objective: Filed Vitals:   06/16/13 1230 06/16/13 1535 06/16/13 2227 06/17/13 0500  BP: 112/71 135/79 105/71 104/64  Pulse: 77 80 80 68  Temp:  98.6 F (37 C) 98.1 F (36.7 C) 97.4 F (36.3 C)  TempSrc:  Oral Oral Oral  Resp: 7 18 16 18   Height:      Weight:      SpO2: 98% 96% 94% 95%    Intake/Output Summary (Last 24 hours) at 06/17/13 1050 Last data filed at 06/16/13 1723  Gross per 24 hour  Intake    540 ml  Output      0 ml  Net    540 ml     Filed Weights   06/15/13 1546 06/15/13 1940 06/16/13 0500  Weight: 68.2 kg (150 lb 5.7 oz) 68.2 kg (150 lb 5.7 oz) 70.8 kg (156 lb 1.4 oz)    Exam:   Afebrile, vital signs stable  General: Appears calm and comfortable. Smiles today. Appears much better.  Cardiovascular: Regular rate and rhythm. No murmur, rub or gallop. No lower extremity edema.  Respiratory: Clear to auscultation bilaterally. No wheezes, rales or rhonchi. Normal respiratory effort.  Abdomen: Soft, nontender, nondistended.  Data Reviewed:  VPA level 141.3 >> 112.8 >> 53  Ammonia 187 >> 109 >> 54  Potassium 3.1  CMP unremarkable  Hgb 11.4, likely from dilution, no evidence of bleeding  Scheduled Meds: . Chlorhexidine Gluconate Cloth  6 each Topical Q0600  . lactulose  300 mL Rectal Once  . mupirocin ointment  1 application Nasal BID  . sodium chloride  3 mL Intravenous Q12H   Continuous Infusions: . sodium chloride 150 mL/hr at 06/17/13 3086    Principal Problem:   Valproic acid toxicity Active Problems:   Suicide attempt   Tinnitus   Abdominal pain  Chronic back pain   Depression   Anxiety   Time spent 25 minutes

## 2013-06-17 NOTE — Care Management Note (Signed)
    Page 1 of 1   06/17/2013     11:32:48 AM   CARE MANAGEMENT NOTE 06/17/2013  Patient:  Sydney [name], Sydney [name]   Account Number:  [id number]  Date Initiated:  06/16/2013  Documentation initiated by:  Lanier Clam  Subjective/Objective Assessment:   52 y/o f admitted w/depakote overdose.Intentional suicide attempt.ZO:[id number].     Action/Plan:   From home.   Anticipated DC Date:  06/18/2013   Anticipated DC Plan:  PSYCHIATRIC HOSPITAL  In-house referral  Financial Counselor      DC Planning Services  CM consult      Choice offered to / List presented to:             Status of service:  Completed, signed off Medicare Important Message given?   (If response is "NO", the following Medicare IM given date fields will be blank) Date Medicare IM given:   Date Additional Medicare IM given:    Discharge Disposition:  PSYCHIATRIC HOSPITAL  Per UR Regulation:  Reviewed for med. necessity/level of care/duration of stay  If discussed at Long Length of Stay Meetings, dates discussed:    Comments:  06/17/13 1130 Arlyss Queen, RN BSN CM Pt medically clear for telepsych consult. Recommends inpatient treatment. CSw is aware of recommendations. No CM needs noted.

## 2013-06-18 NOTE — Clinical Social Work Note (Signed)
CSW received consult for depression. Pt evaluated by telepsych and inpatient recommended. Pt d/c overnight to [place] per notes.   Derenda Fennel, [place] [phone]

## 2013-06-18 NOTE — Progress Notes (Signed)
B.Komal Stangelo, MHT continued placement search efforts by contacting and faxing referrals to following facilities listed below;  [facility]  [place]. Patient has a pending referral to [facility] per Hilda Lias they are expecting discharges on Tues 12/16, TTS or MHT Disposition to follow up on disposition patient has been reviewed and waiting for attending physician to accept.

## 2013-06-18 NOTE — Progress Notes (Signed)
Report called to Faythe Ghee, RN at [facility].  [facility] called to come and transport patient.  Patient in stable condition.

## 2013-06-18 NOTE — Progress Notes (Signed)
Patient left via [facility] transportation to go to [facility].  Patient in stable condition. VSS.

## 2013-06-18 NOTE — Progress Notes (Signed)
B.Aizley Stenseth, MHT received report from [place] at [facility] who reports that patient has been accepted by Dr. Michelle Piper. Writer informed attending nurse Tabatha at [facility] of acceptance and provided nurse to nurse contact number [phone] to [place]. Stephannie Peters will arrange for patient transfer via [employer] during this hour.

## 2013-07-19 ENCOUNTER — Other Ambulatory Visit: Payer: Self-pay | Admitting: Obstetrics and Gynecology

## 2013-11-24 ENCOUNTER — Emergency Department (HOSPITAL_COMMUNITY)
Admission: EM | Admit: 2013-11-24 | Discharge: 2013-11-24 | Disposition: A | Discharge status: Home / Self Care | Payer: MEDICAID

## 2013-11-24 NOTE — ED Notes (Signed)
No answer in waiting room,  

## 2013-12-20 ENCOUNTER — Encounter (HOSPITAL_COMMUNITY): Payer: Self-pay | Admitting: Emergency Medicine

## 2013-12-20 ENCOUNTER — Emergency Department (HOSPITAL_COMMUNITY)
Admission: EM | Admit: 2013-12-20 | Discharge: 2013-12-20 | Disposition: A | Discharge status: Home / Self Care | Payer: Self-pay | Attending: Emergency Medicine | Admitting: Emergency Medicine

## 2013-12-20 DIAGNOSIS — Z791 Long term (current) use of non-steroidal anti-inflammatories (NSAID): Secondary | ICD-10-CM | POA: Insufficient documentation

## 2013-12-20 DIAGNOSIS — Z87828 Personal history of other (healed) physical injury and trauma: Secondary | ICD-10-CM | POA: Insufficient documentation

## 2013-12-20 DIAGNOSIS — G8929 Other chronic pain: Secondary | ICD-10-CM | POA: Insufficient documentation

## 2013-12-20 DIAGNOSIS — F17200 Nicotine dependence, unspecified, uncomplicated: Secondary | ICD-10-CM | POA: Insufficient documentation

## 2013-12-20 DIAGNOSIS — Z8619 Personal history of other infectious and parasitic diseases: Secondary | ICD-10-CM | POA: Insufficient documentation

## 2013-12-20 DIAGNOSIS — M25569 Pain in unspecified knee: Secondary | ICD-10-CM | POA: Insufficient documentation

## 2013-12-20 DIAGNOSIS — Z8659 Personal history of other mental and behavioral disorders: Secondary | ICD-10-CM | POA: Insufficient documentation

## 2013-12-20 DIAGNOSIS — M25561 Pain in right knee: Secondary | ICD-10-CM

## 2013-12-20 HISTORY — DX: Unspecified viral hepatitis C without hepatic coma: B19.20

## 2013-12-20 HISTORY — DX: Dorsalgia, unspecified: M54.9

## 2013-12-20 MED ORDER — IBUPROFEN 400 MG PO TABS
800.0000 mg | ORAL_TABLET | Freq: Once | ORAL | Status: AC
  Administered 2013-12-20: 800 mg via ORAL
  Filled 2013-12-20: qty 2

## 2013-12-20 MED ORDER — OXYCODONE HCL 10 MG PO TABS: 10.0000 mg | ORAL_TABLET | Freq: Four times a day (QID) | ORAL | Status: DC | PRN

## 2013-12-20 MED ORDER — IBUPROFEN 800 MG PO TABS: 800.0000 mg | ORAL_TABLET | Freq: Three times a day (TID) | ORAL | Status: DC
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
## 2013-12-20 NOTE — ED Notes (Signed)
She injured her R knee and is scheduled to have MRI next Friday for the pain but the pain is too severe to wait. She has no pain meds to take at home.

## 2013-12-20 NOTE — Discharge Instructions (Signed)
Take ibuprofen is recommended as needed for pain. Take oxycodone every 6 hours as needed for severe pain. No driving or operating heavy machinery taking oxycodone as it may cause drowsiness. Followup with your orthopedist.  Knee Pain The knee is the complex joint between your thigh and your lower leg. It is made up of bones, tendons, ligaments, and cartilage. The bones that make up the knee are:  The femur in the thigh.  The tibia and fibula in the lower leg.  The patella or kneecap riding in the groove on the lower femur. CAUSES  Knee pain is a common complaint with many causes. A few of these causes are:  Injury, such as:  A ruptured ligament or tendon injury.  Torn cartilage.  Medical conditions, such as:  Gout  Arthritis  Infections  Overuse, over training, or overdoing a physical activity. Knee pain can be minor or severe. Knee pain can accompany debilitating injury. Minor knee problems often respond well to self-care measures or get well on their own. More serious injuries may need medical intervention or even surgery. SYMPTOMS The knee is complex. Symptoms of knee problems can vary widely. Some of the problems are:  Pain with movement and weight bearing.  Swelling and tenderness.  Buckling of the knee.  Inability to straighten or extend your knee.  Your knee locks and you cannot straighten it.  Warmth and redness with pain and fever.  Deformity or dislocation of the kneecap. DIAGNOSIS  Determining what is wrong may be very straight forward such as when there is an injury. It can also be challenging because of the complexity of the knee. Tests to make a diagnosis may include:  Your caregiver taking a history and doing a physical exam.  Routine X-rays can be used to rule out other problems. X-rays will not reveal a cartilage tear. Some injuries of the knee can be diagnosed by:  Arthroscopy a surgical technique by which a small video camera is inserted through  tiny incisions on the sides of the knee. This procedure is used to examine and repair internal knee joint problems. Tiny instruments can be used during arthroscopy to repair the torn knee cartilage (meniscus).  Arthrography is a radiology technique. A contrast liquid is directly injected into the knee joint. Internal structures of the knee joint then become visible on X-ray film.  An MRI scan is a non X-ray radiology procedure in which magnetic fields and a computer produce two- or three-dimensional images of the inside of the knee. Cartilage tears are often visible using an MRI scanner. MRI scans have largely replaced arthrography in diagnosing cartilage tears of the knee.  Blood work.  Examination of the fluid that helps to lubricate the knee joint (synovial fluid). This is done by taking a sample out using a needle and a syringe. TREATMENT The treatment of knee problems depends on the cause. Some of these treatments are:  Depending on the injury, proper casting, splinting, surgery, or physical therapy care will be needed.  Give yourself adequate recovery time. Do not overuse your joints. If you begin to get sore during workout routines, back off. Slow down or do fewer repetitions.  For repetitive activities such as cycling or running, maintain your strength and nutrition.  Alternate muscle groups. For example, if you are a weight lifter, work the upper body on one day and the lower body the next.  Either tight or weak muscles do not give the proper support for your knee. Tight or weak  muscles do not absorb the stress placed on the knee joint. Keep the muscles surrounding the knee strong.  Take care of mechanical problems.  If you have flat feet, orthotics or special shoes may help. See your caregiver if you need help.  Arch supports, sometimes with wedges on the inner or outer aspect of the heel, can help. These can shift pressure away from the side of the knee most bothered by  osteoarthritis.  A brace called an "unloader" brace also may be used to help ease the pressure on the most arthritic side of the knee.  If your caregiver has prescribed crutches, braces, wraps or ice, use as directed. The acronym for this is PRICE. This means protection, rest, ice, compression, and elevation.  Nonsteroidal anti-inflammatory drugs (NSAIDs), can help relieve pain. But if taken immediately after an injury, they may actually increase swelling. Take NSAIDs with food in your stomach. Stop them if you develop stomach problems. Do not take these if you have a history of ulcers, stomach pain, or bleeding from the bowel. Do not take without your caregiver's approval if you have problems with fluid retention, heart failure, or kidney problems.  For ongoing knee problems, physical therapy may be helpful.  Glucosamine and chondroitin are over-the-counter dietary supplements. Both may help relieve the pain of osteoarthritis in the knee. These medicines are different from the usual anti-inflammatory drugs. Glucosamine may decrease the rate of cartilage destruction.  Injections of a corticosteroid drug into your knee joint may help reduce the symptoms of an arthritis flare-up. They may provide pain relief that lasts a few months. You may have to wait a few months between injections. The injections do have a small increased risk of infection, water retention, and elevated blood sugar levels.  Hyaluronic acid injected into damaged joints may ease pain and provide lubrication. These injections may work by reducing inflammation. A series of shots may give relief for as long as 6 months.  Topical painkillers. Applying certain ointments to your skin may help relieve the pain and stiffness of osteoarthritis. Ask your pharmacist for suggestions. Many over the-counter products are approved for temporary relief of arthritis pain.  In some countries, doctors often prescribe topical NSAIDs for relief of  chronic conditions such as arthritis and tendinitis. A review of treatment with NSAID creams found that they worked as well as oral medications but without the serious side effects. PREVENTION  Maintain a healthy weight. Extra pounds put more strain on your joints.  Get strong, stay limber. Weak muscles are a common cause of knee injuries. Stretching is important. Include flexibility exercises in your workouts.  Be smart about exercise. If you have osteoarthritis, chronic knee pain or recurring injuries, you may need to change the way you exercise. This does not mean you have to stop being active. If your knees ache after jogging or playing basketball, consider switching to swimming, water aerobics, or other low-impact activities, at least for a few days a week. Sometimes limiting high-impact activities will provide relief.  Make sure your shoes fit well. Choose footwear that is right for your sport.  Protect your knees. Use the proper gear for knee-sensitive activities. Use kneepads when playing volleyball or laying carpet. Buckle your seat belt every time you drive. Most shattered kneecaps occur in car accidents.  Rest when you are tired. SEEK MEDICAL CARE IF:  You have knee pain that is continual and does not seem to be getting better.  SEEK IMMEDIATE MEDICAL CARE IF:  Your  knee joint feels hot to the touch and you have a high fever. MAKE SURE YOU:   Understand these instructions.  Will watch your condition.  Will get help right away if you are not doing well or get worse. Document Released: 04/17/2007 Document Revised: 09/12/2011 Document Reviewed: 04/17/2007 [facility] Patient Information 2015 [place], [place]. This information is not intended to replace advice given to you by your health care provider. Make sure you discuss any questions you have with your health care provider.

## 2013-12-20 NOTE — ED Notes (Signed)
Pt refused wheelchair, ambulatory without assistance, steady gait. Pt in NAD. sts thankful for care and will follow-up for MRI

## 2013-12-25 NOTE — ED Provider Notes (Signed)
Medical screening examination/treatment/procedure(s) were performed by non-physician practitioner and as supervising physician I was immediately available for consultation/collaboration.   EKG Interpretation None        Mark James, MD 12/25/13 0826 

## 2014-02-15 ENCOUNTER — Encounter (HOSPITAL_COMMUNITY): Payer: Self-pay | Admitting: Emergency Medicine

## 2014-02-15 ENCOUNTER — Emergency Department (HOSPITAL_COMMUNITY)
Admission: EM | Admit: 2014-02-15 | Discharge: 2014-02-15 | Disposition: A | Discharge status: Home / Self Care | Payer: MEDICAID | Attending: Emergency Medicine | Admitting: Emergency Medicine

## 2014-02-15 DIAGNOSIS — K0889 Other specified disorders of teeth and supporting structures: Secondary | ICD-10-CM

## 2014-02-15 DIAGNOSIS — Z8659 Personal history of other mental and behavioral disorders: Secondary | ICD-10-CM | POA: Insufficient documentation

## 2014-02-15 DIAGNOSIS — K089 Disorder of teeth and supporting structures, unspecified: Secondary | ICD-10-CM | POA: Insufficient documentation

## 2014-02-15 DIAGNOSIS — Z8619 Personal history of other infectious and parasitic diseases: Secondary | ICD-10-CM | POA: Insufficient documentation

## 2014-02-15 DIAGNOSIS — G8929 Other chronic pain: Secondary | ICD-10-CM | POA: Insufficient documentation

## 2014-02-15 DIAGNOSIS — Z791 Long term (current) use of non-steroidal anti-inflammatories (NSAID): Secondary | ICD-10-CM | POA: Insufficient documentation

## 2014-02-15 DIAGNOSIS — F17200 Nicotine dependence, unspecified, uncomplicated: Secondary | ICD-10-CM | POA: Insufficient documentation

## 2014-02-15 MED ORDER — OXYCODONE-ACETAMINOPHEN 5-325 MG PO TABS: 1.0000 | ORAL_TABLET | Freq: Four times a day (QID) | ORAL | Status: DC | PRN

## 2014-02-15 NOTE — ED Provider Notes (Signed)
CSN: [id number]     Arrival date & time 02/15/14  1400 History   First MD Initiated Contact with Patient 02/15/14 1410     Chief Complaint  Patient presents with  . Dental Pain     (Consider location/radiation/quality/duration/timing/severity/associated sxs/prior Treatment) Patient is a 53 y.o. female presenting with tooth pain. The history is provided by the patient (pt complains of a toothache).  Dental Pain Location:  Upper Quality:  Dull Onset quality:  Sudden Timing:  Constant Progression:  Worsening Chronicity:  Recurrent Context: not abscess   Associated symptoms: no congestion and no headaches     Past Medical History  Diagnosis Date  . Depression   . C. difficile colitis   . Opiate dependence     detox 06/2012  . Anxiety   . Chronic back pain   . Sciatic pain   . Back pain   . Hepatitis C    Past Surgical History  Procedure Laterality Date  . Abdominal hysterectomy    . Appendectomy    . Left oophorectomy    . Cholecystectomy     Family History  Problem Relation Age of Onset  . Adopted: Yes   History  Substance Use Topics  . Smoking status: Current Every Day Smoker    Types: Cigarettes  . Smokeless tobacco: Not on file  . Alcohol Use: No   OB History   Grav Para Term Preterm Abortions TAB SAB Ect Mult Living                 Review of Systems  Constitutional: Negative for appetite change and fatigue.  HENT: Negative for congestion, ear discharge and sinus pressure.        Toothache  Eyes: Negative for discharge.  Respiratory: Negative for cough.   Cardiovascular: Negative for chest pain.  Gastrointestinal: Negative for abdominal pain and diarrhea.  Genitourinary: Negative for frequency and hematuria.  Musculoskeletal: Negative for back pain.  Skin: Negative for rash.  Neurological: Negative for seizures and headaches.  Psychiatric/Behavioral: Negative for hallucinations.      Allergies  Haldol and Nubain  Home Medications   Prior  to Admission medications   Medication Sig Start Date End Date Taking? Authorizing Provider  ibuprofen (ADVIL,MOTRIN) 800 MG tablet Take 1 tablet (800 mg total) by mouth 3 (three) times daily. 12/20/13   Trevor Maceobyn M Albert, PA-C  Oxycodone HCl 10 MG TABS Take 1 tablet (10 mg total) by mouth every 6 (six) hours as needed. 12/20/13   Trevor Maceobyn M Albert, PA-C   BP 138/65  Pulse 82  Temp(Src) 98.6 F (37 C) (Oral)  Resp 18  Ht 5\' 7"  (1.702 m)  Wt 145 lb (65.772 kg)  BMI 22.71 kg/m2 Physical Exam  Constitutional: She is oriented to person, place, and time. She appears well-developed.  HENT:  Head: Normocephalic.  Tender upper tooth  Eyes: Conjunctivae and EOM are normal.  Neck: Neck supple. No tracheal deviation present.  Cardiovascular:  No murmur heard. Musculoskeletal: Normal range of motion.  Neurological: She is oriented to person, place, and time.  Skin: Skin is warm.  Psychiatric: She has a normal mood and affect. Her behavior is normal.    ED Course  Procedures (including critical care time) Labs Review Labs Reviewed - No data to display  Imaging Review No results found.   EKG Interpretation None      MDM   Final diagnoses:  Tooth pain       Benny LennertJoseph L Jniya Madara, MD 02/15/14 1426

## 2014-02-15 NOTE — ED Notes (Signed)
Pt states she chipped her tooth Thursday

## 2014-02-15 NOTE — Discharge Instructions (Signed)
Follow-up with a dentist as planned. °

## 2014-06-30 ENCOUNTER — Emergency Department (HOSPITAL_COMMUNITY)
Admission: EM | Admit: 2014-06-30 | Discharge: 2014-06-30 | Disposition: A | Discharge status: Home / Self Care | Payer: MEDICAID | Attending: Emergency Medicine | Admitting: Emergency Medicine

## 2014-06-30 ENCOUNTER — Encounter (HOSPITAL_COMMUNITY): Payer: Self-pay | Admitting: Emergency Medicine

## 2014-06-30 DIAGNOSIS — K088 Other specified disorders of teeth and supporting structures: Secondary | ICD-10-CM | POA: Insufficient documentation

## 2014-06-30 DIAGNOSIS — K0889 Other specified disorders of teeth and supporting structures: Secondary | ICD-10-CM

## 2014-06-30 DIAGNOSIS — M549 Dorsalgia, unspecified: Secondary | ICD-10-CM | POA: Insufficient documentation

## 2014-06-30 DIAGNOSIS — Z8739 Personal history of other diseases of the musculoskeletal system and connective tissue: Secondary | ICD-10-CM | POA: Insufficient documentation

## 2014-06-30 DIAGNOSIS — F419 Anxiety disorder, unspecified: Secondary | ICD-10-CM | POA: Insufficient documentation

## 2014-06-30 DIAGNOSIS — K029 Dental caries, unspecified: Secondary | ICD-10-CM | POA: Insufficient documentation

## 2014-06-30 DIAGNOSIS — F329 Major depressive disorder, single episode, unspecified: Secondary | ICD-10-CM | POA: Insufficient documentation

## 2014-06-30 DIAGNOSIS — G8929 Other chronic pain: Secondary | ICD-10-CM | POA: Insufficient documentation

## 2014-06-30 DIAGNOSIS — Z79899 Other long term (current) drug therapy: Secondary | ICD-10-CM | POA: Insufficient documentation

## 2014-06-30 DIAGNOSIS — Z8619 Personal history of other infectious and parasitic diseases: Secondary | ICD-10-CM | POA: Insufficient documentation

## 2014-06-30 DIAGNOSIS — Z720 Tobacco use: Secondary | ICD-10-CM | POA: Insufficient documentation

## 2014-06-30 MED ORDER — IBUPROFEN 800 MG PO TABS: 800.0000 mg | ORAL_TABLET | Freq: Three times a day (TID) | ORAL | Status: DC

## 2014-06-30 MED ORDER — HYDROCODONE-ACETAMINOPHEN 5-325 MG PO TABS: 1.0000 | ORAL_TABLET | ORAL | Status: DC | PRN

## 2014-06-30 NOTE — ED Notes (Signed)
Pt reports dental pain x2 days. nad noted.  

## 2014-06-30 NOTE — Discharge Instructions (Signed)

## 2014-06-30 NOTE — ED Notes (Signed)
Attempted to call pt for triage x3. Pt not found in waiting room.

## 2014-06-30 NOTE — ED Provider Notes (Signed)
CSN: [id number]     Arrival date & time 06/30/14  1455 History  This chart was scribed for non-physician practitioner, Ivery QualeHobson Lilyth Lawyer, PA-C working with Raeford RazorStephen Kohut, MD by Luisa DagoPriscilla Tutu, ED scribe. This patient was seen in room APFT24/APFT24 and the patient's care was started at 5:58 PM.    Chief Complaint  Patient presents with  . Dental Pain   The history is provided by the patient. No language interpreter was used.   HPI Comments: Sydney [name] is a 53 y.o. female with a PMhx of depression, anxiety, Hep C, and chronic back pain, presents to the Emergency Department complaining of gradual onset worsening left sided lower dental pain that started approximately 2 days ago. Pt states that [facility], [facility] but she can no longer tolerate the pain. Positive left sided otalgia. Denies any fever, chills, nausea, emesis, abdominal pain, or HA.   Past Medical History  Diagnosis Date  . Depression   . C. difficile colitis   . Opiate dependence     detox 06/2012  . Anxiety   . Chronic back pain   . Sciatic pain   . Back pain   . Hepatitis C    Past Surgical History  Procedure Laterality Date  . Abdominal hysterectomy    . Appendectomy    . Left oophorectomy    . Cholecystectomy     Family History  Problem Relation Age of Onset  . Adopted: Yes   History  Substance Use Topics  . Smoking status: Current Every Day Smoker    Types: Cigarettes  . Smokeless tobacco: Not on file  . Alcohol Use: No   OB History    No data available     Review of Systems  Constitutional: Negative for fever.  HENT: Positive for dental problem. Negative for facial swelling and sore throat.   Respiratory: Negative for shortness of breath.   Musculoskeletal: Positive for back pain. Negative for neck pain and neck stiffness.  Psychiatric/Behavioral: The patient is nervous/anxious.   All other systems reviewed and are negative.     Allergies  Haldol  and Nubain  Home Medications   Prior to Admission medications   Medication Sig Start Date End Date Taking? Authorizing Provider  citalopram (CELEXA) 20 MG tablet Take 1 tablet by mouth daily.    Historical Provider, MD  gabapentin (NEURONTIN) 300 MG capsule Take 300 mg by mouth 2 (two) times daily. 11/17/13 11/17/14  Historical Provider, MD  ibuprofen (ADVIL,MOTRIN) 600 MG tablet Take 600 mg by mouth as needed for mild pain.    Historical Provider, MD  oxyCODONE-acetaminophen (PERCOCET/ROXICET) 5-325 MG per tablet Take 1 tablet by mouth every 6 (six) hours as needed. 02/15/14   Benny LennertJoseph L Zammit, MD   Triage Vitals:BP 134/91 mmHg  Pulse 64  Temp(Src) 98.6 F (37 C) (Oral)  Resp 18  Ht 5\' 8"  (1.727 m)  Wt 140 lb (63.504 kg)  BMI 21.29 kg/m2  SpO2 100%  Physical Exam  Constitutional: She is oriented to person, place, and time. She appears well-developed and well-nourished. No distress.  HENT:  Head: Normocephalic and atraumatic.  No facial asymmetry. Normal TM's bilaterally. Selling of the left upper gum. Dental carries of the teeth of the upper left gum. Airway is patent. No swelling under the tongue.  Eyes: Conjunctivae and EOM are normal.  Neck: Neck supple.  Cardiovascular: Normal rate and regular rhythm.  Exam reveals no gallop and no  friction rub.   No murmur heard. Pulmonary/Chest: Effort normal and breath sounds normal. No respiratory distress. She has no wheezes. She has no rales. She exhibits no tenderness.  Musculoskeletal: Normal range of motion.  Lymphadenopathy:    She has no cervical adenopathy.  Neurological: She is alert and oriented to person, place, and time.  Skin: Skin is warm and dry.  Psychiatric: She has a normal mood and affect. Her behavior is normal.  Nursing note and vitals reviewed.   ED Course  Procedures (including critical care time)  DIAGNOSTIC STUDIES: Oxygen Saturation is 100% on RA, normal by my interpretation.    COORDINATION OF CARE: 6:04  PM- Will order pain medication to alleviate pt's discomfort. Pt advised of plan for treatment and pt agrees.  Labs Review Labs Reviewed - No data to display  Imaging Review No results found.   EKG Interpretation None      MDM Pt has hx of dental problems. She [facility]. She is on pen VK, but out of pain meds. PCP will not order pain medication per pt. No evidence for major infection or Ludwig' Angina. No Trismus. Rx for ibuprofen and norco given.   Final diagnoses:  Toothache    *I have reviewed nursing notes, vital signs, and all appropriate lab and imaging results for this patient.**  **I personally performed the services described in this documentation, which was scribed in my presence. The recorded information has been reviewed and is accurate.Kathie Dike*   Rozelle Caudle M Aadam Zhen, PA-C 06/30/14 1818  Raeford RazorStephen Kohut, MD 07/03/14 [phone]

## 2014-11-09 ENCOUNTER — Encounter (HOSPITAL_COMMUNITY): Payer: Self-pay | Admitting: *Deleted

## 2014-11-09 ENCOUNTER — Emergency Department (HOSPITAL_COMMUNITY)
Admission: EM | Admit: 2014-11-09 | Discharge: 2014-11-09 | Disposition: A | Discharge status: Home / Self Care | Payer: Self-pay | Attending: Emergency Medicine | Admitting: Emergency Medicine

## 2014-11-09 DIAGNOSIS — F329 Major depressive disorder, single episode, unspecified: Secondary | ICD-10-CM | POA: Insufficient documentation

## 2014-11-09 DIAGNOSIS — K029 Dental caries, unspecified: Secondary | ICD-10-CM | POA: Insufficient documentation

## 2014-11-09 DIAGNOSIS — F419 Anxiety disorder, unspecified: Secondary | ICD-10-CM | POA: Insufficient documentation

## 2014-11-09 DIAGNOSIS — Z79899 Other long term (current) drug therapy: Secondary | ICD-10-CM | POA: Insufficient documentation

## 2014-11-09 DIAGNOSIS — K0889 Other specified disorders of teeth and supporting structures: Secondary | ICD-10-CM

## 2014-11-09 DIAGNOSIS — Z8739 Personal history of other diseases of the musculoskeletal system and connective tissue: Secondary | ICD-10-CM | POA: Insufficient documentation

## 2014-11-09 DIAGNOSIS — K088 Other specified disorders of teeth and supporting structures: Secondary | ICD-10-CM | POA: Insufficient documentation

## 2014-11-09 DIAGNOSIS — Z8619 Personal history of other infectious and parasitic diseases: Secondary | ICD-10-CM | POA: Insufficient documentation

## 2014-11-09 DIAGNOSIS — G8929 Other chronic pain: Secondary | ICD-10-CM | POA: Insufficient documentation

## 2014-11-09 MED ORDER — IBUPROFEN 800 MG PO TABS
800.0000 mg | ORAL_TABLET | Freq: Once | ORAL | Status: AC
  Administered 2014-11-09: 800 mg via ORAL
  Filled 2014-11-09: qty 1

## 2014-11-09 MED ORDER — IBUPROFEN 800 MG PO TABS: 800.0000 mg | ORAL_TABLET | Freq: Three times a day (TID) | ORAL | Status: DC

## 2014-11-09 MED ORDER — CLINDAMYCIN HCL 150 MG PO CAPS
300.0000 mg | ORAL_CAPSULE | Freq: Once | ORAL | Status: AC
  Administered 2014-11-09: 300 mg via ORAL
  Filled 2014-11-09: qty 2

## 2014-11-09 MED ORDER — TRAMADOL HCL 50 MG PO TABS: 50.0000 mg | ORAL_TABLET | Freq: Four times a day (QID) | ORAL | Status: DC | PRN

## 2014-11-09 MED ORDER — CLINDAMYCIN HCL 150 MG PO CAPS: 300.0000 mg | ORAL_CAPSULE | Freq: Four times a day (QID) | ORAL | Status: DC

## 2014-11-09 NOTE — ED Notes (Signed)
Pt c/o toothache to front teeth; pt states her front tooth broke off x 2 weeks ago and started having pain x 2 days ago; pt [facility]

## 2014-11-09 NOTE — ED Provider Notes (Signed)
CSN: [id number]     Arrival date & time 11/09/14  1841 History  This chart was scribed for a non-physician practitioner, Pauline Ausammy Azarah Dacy, PA-C working with Donnetta HutchingBrian Cook, MD by [place] Peace, ED Scribe. The patient was seen in APFT21/APFT21. The patient's care was started at 7:24 PM.    Chief Complaint  Patient presents with  . Dental Pain      Patient is a 54 y.o. female presenting with tooth pain. The history is provided by the patient. No language interpreter was used.  Dental Pain Associated symptoms: facial swelling   Associated symptoms: no congestion, no fever, no headaches and no neck pain   HPI Comments: Sydney [name] is a 54 y.o. female who presents to the Emergency Department complaining of severe dental pain x 2 days stemming from broken right foot tooth that she fractured 2 weeks ago. She also complains of facial swelling, sore throat, and pain with chewing. No complaints of a fever. She notes she has tried taking pain medication and applying Orajel to affected area with minimal relief. Pt states that she is on the waiting list to [facility]. She also denies neck pain, difficulty swallowing or breathing, and difficulty opening or closing her mouth.  Pt is current everyday smoker.    Past Medical History  Diagnosis Date  . Depression   . C. difficile colitis   . Opiate dependence     detox 06/2012  . Anxiety   . Chronic back pain   . Sciatic pain   . Back pain   . Hepatitis C    Past Surgical History  Procedure Laterality Date  . Abdominal hysterectomy    . Appendectomy    . Left oophorectomy    . Cholecystectomy     Family History  Problem Relation Age of Onset  . Adopted: Yes   History  Substance Use Topics  . Smoking status: Current Every Day Smoker    Types: Cigarettes  . Smokeless tobacco: Not on file  . Alcohol Use: No   OB History    No data available     Review of Systems  Constitutional: Negative for fever and  appetite change.  HENT: Positive for dental problem and facial swelling. Negative for congestion, ear discharge, sinus pressure and trouble swallowing.   Eyes: Negative for pain and visual disturbance.  Respiratory: Negative for cough.   Cardiovascular: Negative for chest pain.  Gastrointestinal: Negative for vomiting and abdominal pain.  Genitourinary: Negative for frequency and hematuria.  Musculoskeletal: Positive for back pain. Negative for neck pain and neck stiffness.  Skin: Negative for rash.  Neurological: Negative for dizziness, facial asymmetry and headaches.  Hematological: Negative for adenopathy.  All other systems reviewed and are negative.     Allergies  Haldol and Nubain  Home Medications   Prior to Admission medications   Medication Sig Start Date End Date Taking? Authorizing Provider  citalopram (CELEXA) 20 MG tablet Take 1 tablet by mouth daily.    Historical Provider, MD  gabapentin (NEURONTIN) 300 MG capsule Take 300 mg by mouth 2 (two) times daily. 11/17/13 11/17/14  Historical Provider, MD  HYDROcodone-acetaminophen (NORCO/VICODIN) 5-325 MG per tablet Take 1 tablet by mouth every 4 (four) hours as needed. 06/30/14   Ivery QualeHobson Bryant, PA-C  ibuprofen (ADVIL,MOTRIN) 800 MG tablet Take 1 tablet (800 mg total) by mouth 3 (three) times daily. 06/30/14   Ivery QualeHobson Bryant, PA-C  oxyCODONE-acetaminophen (PERCOCET/ROXICET) 5-325 MG per tablet Take  1 tablet by mouth every 6 (six) hours as needed. 02/15/14   Bethann BerkshireJoseph Zammit, MD   BP 120/80 mmHg  Pulse 76  Temp(Src) 98.8 F (37.1 C) (Oral)  Resp 20  Ht 5\' 8"  (1.727 m)  Wt 135 lb (61.236 kg)  BMI 20.53 kg/m2  SpO2 99% Physical Exam  Constitutional: She is oriented to person, place, and time. She appears well-developed and well-nourished. No distress.  HENT:  Head: Normocephalic and atraumatic.  Mouth/Throat: Oropharynx is clear and moist. No trismus in the jaw. Dental caries present.  Tenderness and dental caries of right  upper central and lateral incisors. Wide spread periodontal disease. Mild swelling of upper lip. No obvious abscess, trismus or sublingual abnormalities.   Eyes: Conjunctivae and EOM are normal.  Neck: Normal range of motion. Neck supple. No tracheal deviation present.  Cardiovascular: Normal rate, regular rhythm and normal heart sounds.  Exam reveals no gallop and no friction rub.   No murmur heard. Pulmonary/Chest: Effort normal and breath sounds normal. No respiratory distress.  Musculoskeletal: Normal range of motion.  Lymphadenopathy:    She has no cervical adenopathy.  Neurological: She is alert and oriented to person, place, and time.  Skin: Skin is warm and dry.  Psychiatric: She has a normal mood and affect. Her behavior is normal.  Nursing note and vitals reviewed.   ED Course  Procedures (including critical care time) Labs Review Labs Reviewed - No data to display  Imaging Review No results found.   EKG Interpretation None     Medications - No data to display  7:28 PM- Treatment plan was discussed with patient who verbalizes understanding and agrees.   MDM   Final diagnoses:  Pain, dental    Patient reviewed on the [employer] narcotic database.  Received #90 oxycodone 10/325 mg on 10/21/14.   Patient is well appearing.  Airway patent, no concerning sx's for Ludwig's angina.  Has mild edema of the upper lip and into the right nasolabial fold.  No obvious abscess.  Pt advised that she will need close dental f/u, referral info given.  Pt agrees to return for any worsening sx's.  I personally performed the services described in this documentation, which was scribed in my presence. The recorded information has been reviewed and is accurate.   Pauline Ausammy Tyffani Foglesong, PA-C 11/10/14 1159  Donnetta HutchingBrian Cook, MD 11/10/14 [phone]

## 2014-11-09 NOTE — Discharge Instructions (Signed)
Dental Pain °Toothache is pain in or around a tooth. It may get worse with chewing or with cold or heat.  °HOME CARE °· Your dentist may use a numbing medicine during treatment. If so, you may need to avoid eating until the medicine wears off. Ask your dentist about this. °· Only take medicine as told by your dentist or doctor. °· Avoid chewing food near the painful tooth until after all treatment is done. Ask your dentist about this. °GET HELP RIGHT AWAY IF:  °· The problem gets worse or new problems appear. °· You have a fever. °· There is redness and puffiness (swelling) of the face, jaw, or neck. °· You cannot open your mouth. °· There is pain in the jaw. °· There is very bad pain that is not helped by medicine. °MAKE SURE YOU:  °· Understand these instructions. °· Will watch your condition. °· Will get help right away if you are not doing well or get worse. °Document Released: 12/07/2007 Document Revised: 09/12/2011 Document Reviewed: 12/07/2007 °ExitCare® Patient Information ©2015 [employer]. This information is not intended to replace advice given to you by your health care provider. Make sure you discuss any questions you have with your health care provider. ° ° ° °Emergency Department Resource Guide °1) Find a Doctor and Pay Out of Pocket °Although you won't have to find out who is covered by your insurance plan, it is a good idea to ask around and get recommendations. You will then need to call the office and see if the doctor you have chosen will accept you as a new patient and what types of options they offer for patients who are self-pay. Some doctors offer discounts or will set up payment plans for their patients who do not have insurance, but you will need to ask so you aren't surprised when you get to your appointment. ° °2) Contact Your Local Health Department °Not all health departments have doctors that can see patients for sick visits, but many do, so it is worth a call to see if yours does.  If you don't know where your local health department is, you can check in your phone book. The CDC also has a tool to help you locate your state's health department, and many state websites also have listings of all of their local health departments. ° °3) Find a Walk-in Clinic °If your illness is not likely to be very severe or complicated, you may want to try a walk in clinic. These are popping up all over the country in pharmacies, drugstores, and shopping centers. They're usually staffed by nurse practitioners or physician assistants that have been trained to treat common illnesses and complaints. They're usually fairly quick and inexpensive. However, if you have serious medical issues or chronic medical problems, these are probably not your best option. ° °No Primary Care Doctor: °- Call [facility] at  832-8000 - they can help you locate a primary care doctor that  accepts your insurance, provides certain services, etc. °- Physician Referral Service- 1-800-533-3463 ° °Chronic Pain Problems: °Organization         Address  Phone   Notes  °[facility]  (336) 297-2271 Patients need to be referred by their primary care doctor.  ° °Medication Assistance: °Organization         Address  Phone   Notes  °[place] [facility] [address] °[place], [place] [address] (336) 641-8030 --Must be a resident   of [place] °-- Must have NO insurance coverage whatsoever (no Medicaid/ Medicare, etc.) °-- The pt. MUST have a primary care doctor that directs their care regularly and follows them in the community °  °[facility]  (866) 331-1348   °[employer]  (888) 892-1162   ° °Agencies that provide inexpensive medical care: °Organization         Address  Phone   Notes  °[facility]  (336) 832-8035   °[facility]    (336) 832-7272   °[facility] [address] °[place], [place] [address] (336) 832-4777   °[facility] [address], °[place] (336) 271-4999   °[facility]    (336) 373-0678   °[facility]    (336) 272-1050   °[facility] ° [address], [place] Phone:  (336) 832-4444, Fax:  (336) 832-4440 Hours of Operation:  9 am - 6 pm, M-F.  Also accepts Medicaid/Medicare and self-pay.  °[facility] ° [address], [place] Phone: (336) 832-3150, Fax: (336) 832-3151. Hours of Operation:  8:30 am - 5:30 pm, M-F.  Also accepts Medicaid and self-pay.  °[facility] [place] [address], [place] Phone: (336) 878-6027   °[facility] [address], [place], [place] (336)723-1848, Ext. 123 Mondays & Thursdays: 7-9 AM.  First 15 patients are seen on a first come, first serve basis. °  ° °Medicaid-accepting [place] Providers: ° °Organization         Address  Phone   Notes  °[facility] [address], [place] (336) 641-2100 Also accepts self-pay patients.  °[facility] [address], [place] ° (336) 856-9996   °[facility] [address], [place] (336) 288-8857   °[facility] [address] [place] [address], [place] (336) 299-7000   °Veita Bland [address], [place]  ° (336) 373-1557 Only accepts [place] Access Medicaid patients after they have their name applied to their card.  ° °Self-Pay (no insurance) in [place]: ° °Organization         Address  Phone   Notes  °Sickle Cell Patients, [facility] [address], [place] (336) 832-1970   °[facility] [address], [place] (336) 832-4400   °[facility] ° [address], [place] (336) 992-4800   °[facility]/Dr. Osei-Bonsu ° [address] [place] [address], [place] or [address], [place] (336) 841-8500  Phone number for both [place] and [place] locations is the same.  °[facility] [address], [place] (336) 299-0000   °[facility] [address] [place] [address], [place] or [address] (336) 852-7530 °(336) 878-2260   °[facility] [address], [place] (336) 350-1642, phone; (336) 294-5005, fax Sees patients 1st and 3rd Saturday of every month.  Must not qualify for public or private insurance (i.e. Medicaid, Medicare, [place] Health Choice, Veterans' Benefits) • Household income should be no more than 200% of the poverty level •The clinic cannot treat you if you are pregnant or think you are pregnant • Sexually transmitted diseases are not treated at the clinic.  ° ° °Dental Care: °Organization         Address    Phone  Notes  °[facility] [facility] [address], [place] (336) 641-6152 Accepts children up to age 21 who are enrolled in Medicaid or [place] Health Choice; pregnant women with a Medicaid card; and children who have applied for Medicaid or [place] Health Choice, but were declined, whose parents can pay a reduced fee at time of service.  °[facility] [place]  [address], [place] (336) 641-7733 Accepts children up to age 21 who are enrolled in Medicaid or [place] Health Choice; pregnant women with a Medicaid card; and children who have applied for Medicaid or [place] Health Choice, but were declined, whose parents can pay a reduced fee at time of service.  °[facility] ° [address], [place] (336) 641-4533 Patients are seen by appointment only. Walk-ins are not accepted. [facility] will see patients 18 years of age and older. °Monday - Tuesday (8am-5pm) °Most Wednesdays (8:30-5pm) °$30 per visit, cash only  °[facility] ° [address], [place] (336) 641-4533 Patients are seen by appointment  only. Walk-ins are not accepted. [facility] will see patients 18 years of age and older. °One Wednesday Evening (Monthly: Volunteer Based).  $30 per visit, cash only  °[facility]  (919) 537-3737 for adults; Children under age 4, call Graduate Pediatric Dentistry at (919) [phone]. Children aged 4-14, please call (919) [phone] to request a pediatric application. ° Dental services are provided in all areas of dental care including fillings, crowns and bridges, complete and partial dentures, implants, gum treatment, root canals, and extractions. Preventive care is also provided. Treatment is provided to both adults and children. °Patients are selected via a lottery and there is often a waiting list. °  °[facility] [address], °[place] ° (336) 763-8833 [email or web address] °  °[facility] [address], [place], [place] (336)723-1848, Ext. 123 Second and Fourth Thursday of each month, opens at 6:30 AM; Clinic ends at 9 AM.  Patients are seen on a first-come first-served basis, and a limited number are seen during each clinic.  ° °[facility] ° [address], [place], [place] (336) 723-7904   Eligibility Requirements °You must have lived in [place], [place], or [place] counties for at least the last three months. °  You cannot be eligible for state or federal sponsored healthcare insurance, including Veterans Administration, Medicaid, or Medicare. °  You generally cannot be eligible for healthcare insurance through your employer.  °  How to apply: °Eligibility screenings are held every Tuesday and Wednesday afternoon from 1:00 pm until 4:00 pm. You do not need an appointment for the interview!  °[facility] [address], [place], [place] 336-631-2330   °[facility]  336-342-8273   °[facility]  336-703-3100   °[facility]  336-570-6415   ° °Behavioral Health  Resources in the Community: °Intensive Outpatient Programs °Organization         Address  Phone  Notes  °[facility] [address], [place], [place] 336-878-6098   °[facility] [address], [place], [place] 336-832-9800   °[facility] [address], [place], [place] ° 336-882-2125   °[facility] [address],  °[place], [place] 1-800-853-5163 or 336-641-4981   °Substance Abuse Resources °Organization           Address  Phone  Notes  °[facility]  336-882-2125   °[facility]  336-784-9470   °[facility]  336-285-9073   °[facility]  336-845-3988   °Residential & Outpatient Substance Abuse Program  1-800-659-3381   °Psychological Services °Organization         Address  Phone  Notes  °[facility]  336- 832-9600   °[facility]  336- 378-7881   °[facility] [address], [place] 1-800-853-5163 or 336-641-4981   ° °Mobile Crisis Teams °Organization         Address  Phone  Notes  °[facility], Mobile Crisis Care Unit  1-877-626-1772   °Assertive °[facility] ° [address] [place], [place] 336-834-9664   °Sharon DeEsch [address] °[place] 336-554-5454   ° °Self-Help/Support Groups °Organization         Address  Phone             Notes  °[facility] - variety of support groups  336- 373-1402 Call for more information  °Narcotics Anonymous (NA), [facility] [address], °[place]  2 meetings at this location  ° °Residential Treatment Programs °Organization         Address  Phone  Notes  °[facility] [address],    °[place]  1-866-801-8205   °[facility] ° [address], [place], [place] 704-293-8524   °[facility] [address], [place] 336-845-3988 Admissions: 8am-3pm M-F  °[facility] [address]    °[place], [place] 336-841-1104   °[facility] [address], [place], [place] 336-379-7146   °[facility] [address]  °[place], [place] 336-285-9073   °[facility] - Intensive Outpatient [address], [place], [place] 336-852-3033   °[facility] [address]  °[place], [place] 1-877-615-2722 or 336-784-9470   °[facility] ([facility] [address] [place], [place] 336-227-7417 Accepts Medicaid  °[facility] [address]  °[place] 1-800-659-3381 Substance Abuse/Addiction Treatment  ° °[place] Behavioral Health Resources °Organization         Address  Phone  Notes  °[facility]  (888) 581-9988   °Julie Brannon, PhD [address] [place], [place]   (336) 349-5553 or (336) 951-0000   °[facility]   [address] °[place], [place] (336) 349-4454   °[facility] [address], [place], [place] (336) 342-8316 Insurance/Medicaid/sponsorship through [facility]  °[facility] [address]                                    [place], [place] (336) 342-8316 Therapy/tele-psych/case  °[facility] [address]  ° [place], [place] (336) 349-2233    °Dr. Arfeen  (336) 349-4544   °[facility] [place]  [employer] [place] [facility] 1) [address], [place] °2) [address], [place] °3)  [address], [place] (336) 349-3220 °(336) 342-7768 ° °(336) 342-8140   °[place] [facility] (336) 342-1394 or (336) 342-3537 (After Hours)    ° °  °

## 2015-05-26 IMAGING — US US ABDOMEN COMPLETE
2 series · 14 of 25 positions shown · non-contrast
Comparison: None.

CLINICAL DATA: Mid abdomen pain

EXAM:
ULTRASOUND ABDOMEN COMPLETE

[Series 1: us abdomen complete · 0.15mm/px · 11 of 64 slices shown (1 of 2)]
[im 1/64]
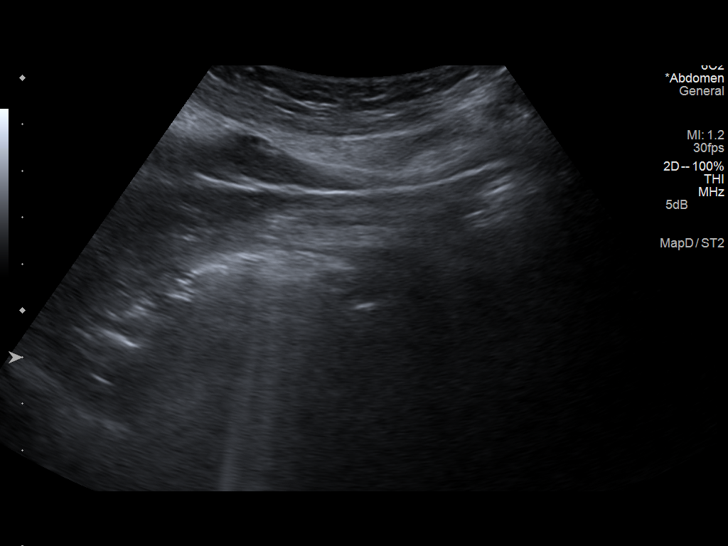
[im 7/64]
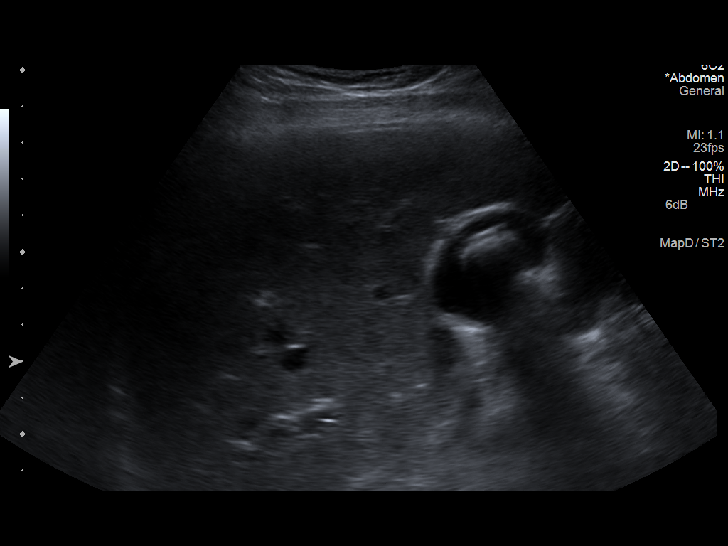
[im 14/64]
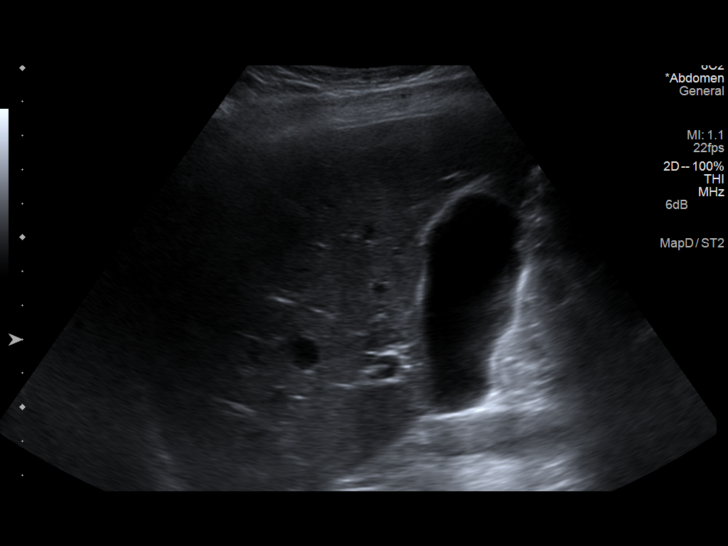
[im 20/64]
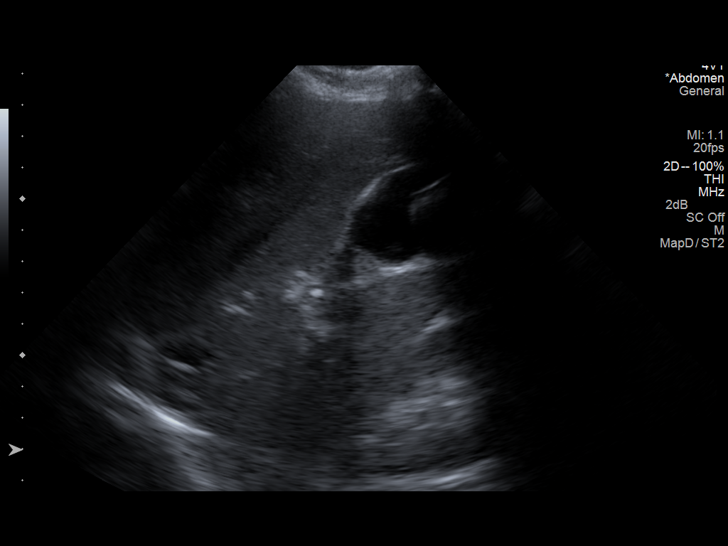
[im 27/64]
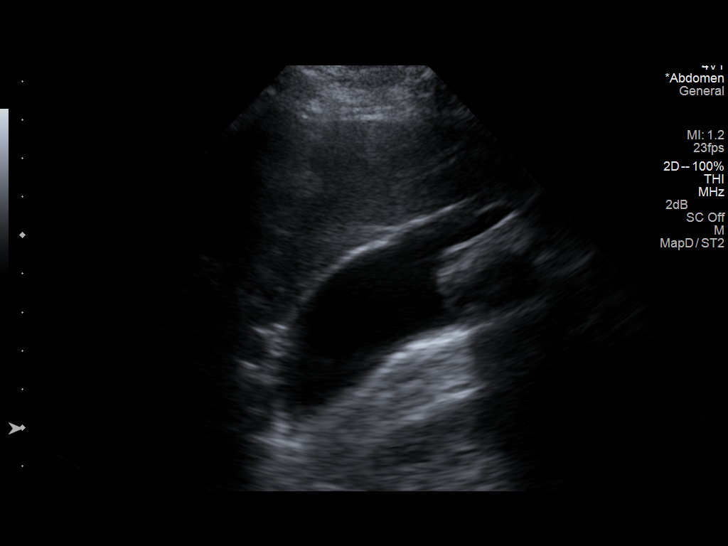
[im 30/64]
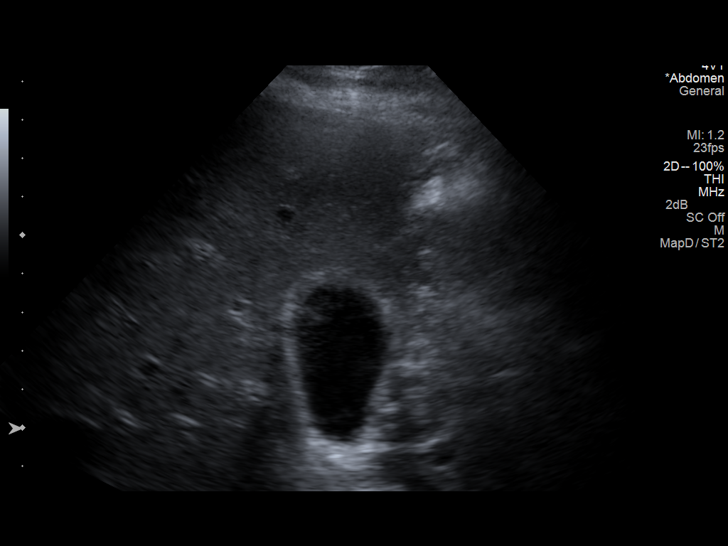
[im 37/64]
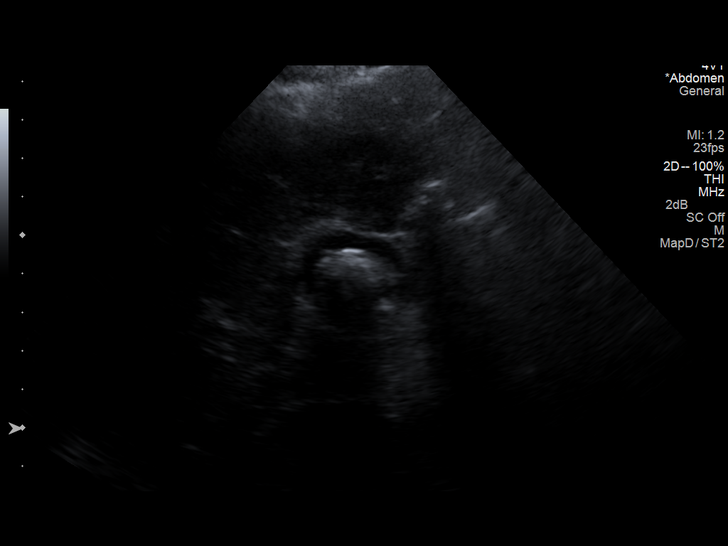
[im 44/64]
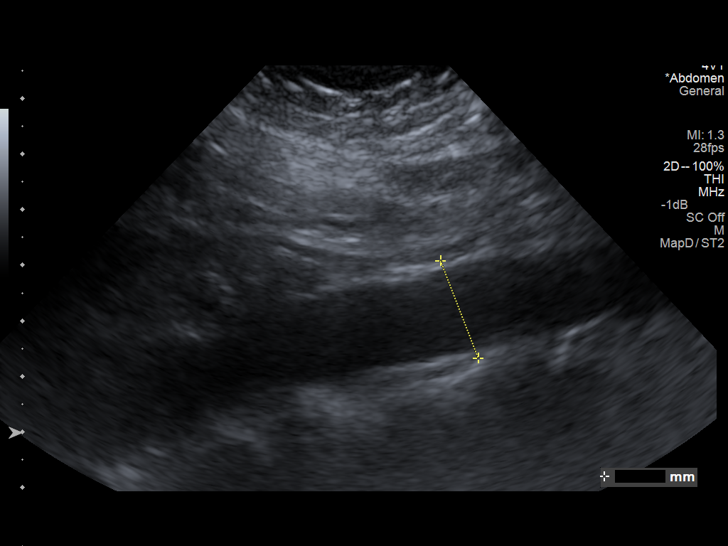
[im 50/64]
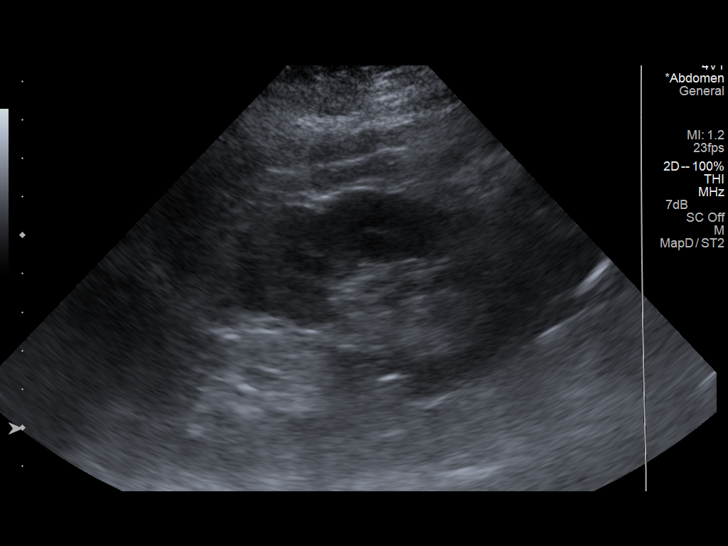
[im 54/64]
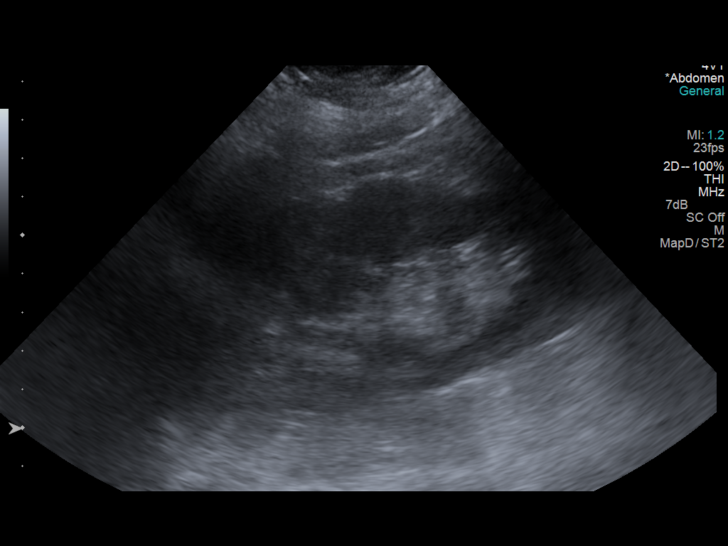
[im 60/64]
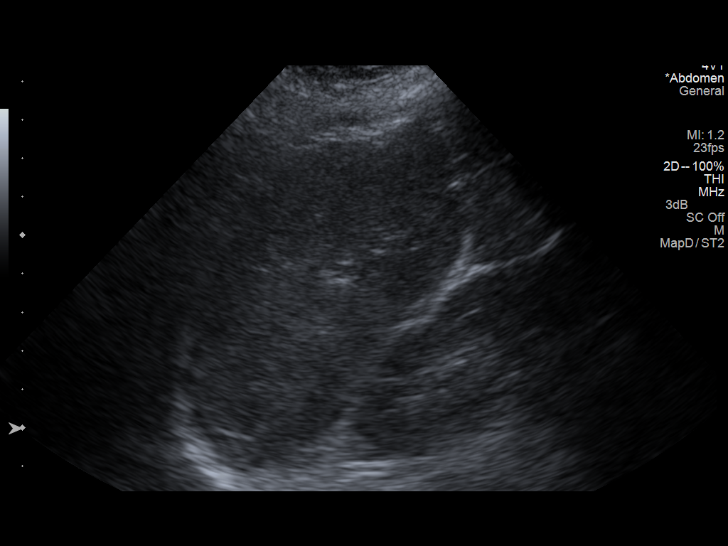

[Series 2001: us abdomen complete · 0.23mm/px · 3 of 18 slices shown (2 of 2)]
[im 1/18]
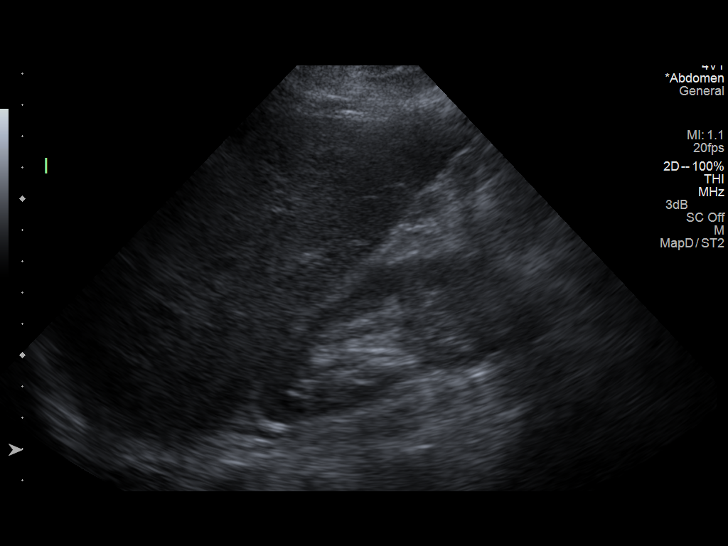
[im 9/18]
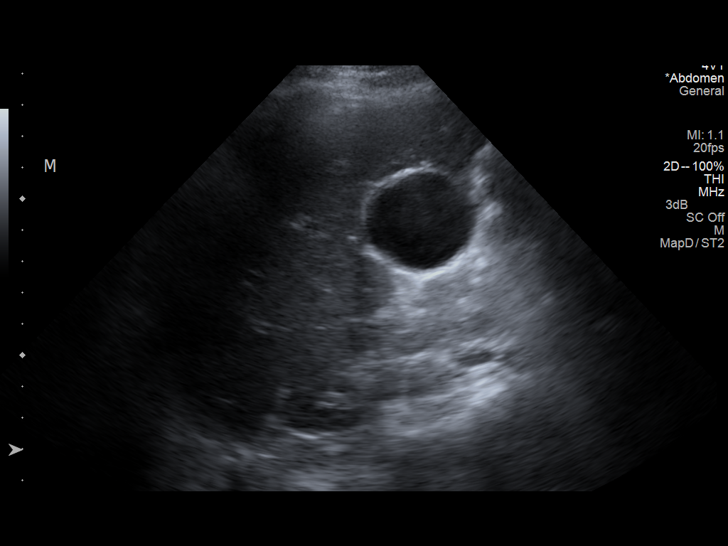
[im 18/18]
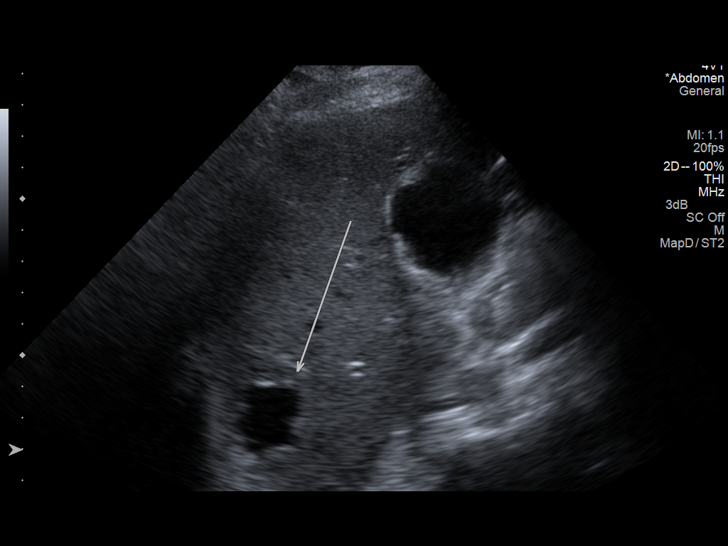

[14 of 25 positions shown; findings below may reference images not displayed]

FINDINGS: Gallbladder

Single gallstone identified within the gallbladder. No sonographic
Murphy sign noted.

Common bile duct

Diameter: Mildly enlarged measuring 7 mm.

Liver

There is a 2.7 x 2.4 x 2.2 cm minimal complicated cyst in the right
lobe liver. Within normal limits in parenchymal echogenicity.

IVC

No abnormality visualized.

Pancreas

Not well visualized due to overlying bowel gas.

Spleen

Size and appearance within normal limits.

Right Kidney

Length: 9.9 cm. Echogenicity within normal limits. No mass or
hydronephrosis visualized.

Left Kidney

Length: 11.8 cm. Echogenicity within normal limits. No mass or
hydronephrosis visualized.

Abdominal aorta

No aneurysm visualized.
IMPRESSION: Cholelithiasis without sonographic evidence of acute cholecystitis.
Minimal enlarged common bile duct measuring 7 mm.

## 2015-12-10 ENCOUNTER — Encounter (HOSPITAL_COMMUNITY): Payer: Self-pay | Admitting: Emergency Medicine

## 2015-12-10 ENCOUNTER — Emergency Department (HOSPITAL_COMMUNITY)
Admission: EM | Admit: 2015-12-10 | Discharge: 2015-12-10 | Disposition: A | Discharge status: Home / Self Care | Payer: MEDICAID | Attending: Emergency Medicine | Admitting: Emergency Medicine

## 2015-12-10 DIAGNOSIS — F17210 Nicotine dependence, cigarettes, uncomplicated: Secondary | ICD-10-CM | POA: Insufficient documentation

## 2015-12-10 DIAGNOSIS — K0889 Other specified disorders of teeth and supporting structures: Secondary | ICD-10-CM | POA: Insufficient documentation

## 2015-12-10 DIAGNOSIS — F329 Major depressive disorder, single episode, unspecified: Secondary | ICD-10-CM | POA: Insufficient documentation

## 2015-12-10 MED ORDER — PENICILLIN V POTASSIUM 500 MG PO TABS: 500.0000 mg | ORAL_TABLET | Freq: Four times a day (QID) | ORAL | Status: DC

## 2015-12-10 MED ORDER — NAPROXEN 250 MG PO TABS: 250.0000 mg | ORAL_TABLET | Freq: Two times a day (BID) | ORAL | Status: DC

## 2015-12-10 NOTE — Discharge Instructions (Signed)

## 2015-12-10 NOTE — ED Notes (Signed)
Pt here requesting antibiotics for her dental pain. States that over the past 3 days she has barely been able to eat due to pain. Pt also c/o general malaise. Denies any n/v/d or fever/chills.

## 2015-12-10 NOTE — ED Notes (Signed)
Instructed pt to take all of antibiotics as prescribed. 

## 2015-12-10 NOTE — ED Provider Notes (Signed)
CSN: [id number]     Arrival date & time 12/10/15  1320 History   First MD Initiated Contact with Patient 12/10/15 1428     Chief Complaint  Patient presents with  . Dental Pain   Sydney [name] is a 55 y.o. female Who presents to the emergency department complaining of upper dental pain ongoing for several months that is worsened over the past several weeks. Patient complains of bilateral upper dental pain. She reports she has follow-up with the [facility] clinic later this month. She reports in the meantime she is having continued pain. She reports pain is worse with chewing and cold air. She's been taking ibuprofen with little relief of her symptoms. The patient denies fevers, discharge or mouth, sore throat, trouble swallowing, neck pain, neck stiffness, abdominal pain, nausea, vomiting or diarrhea.   Patient is a 55 y.o. female presenting with tooth pain. The history is provided by the patient. No language interpreter was used.  Dental Pain Associated symptoms: no drooling, no facial swelling, no fever, no headaches and no neck pain     Past Medical History  Diagnosis Date  . Depression   . C. difficile colitis   . Opiate dependence (HCC)     detox 06/2012  . Anxiety   . Chronic back pain   . Sciatic pain   . Back pain   . Hepatitis C    Past Surgical History  Procedure Laterality Date  . Abdominal hysterectomy    . Appendectomy    . Left oophorectomy    . Cholecystectomy     Family History  Problem Relation Age of Onset  . Adopted: Yes   Social History  Substance Use Topics  . Smoking status: Current Every Day Smoker -- 1.00 packs/day    Types: Cigarettes  . Smokeless tobacco: None  . Alcohol Use: No   OB History    No data available     Review of Systems  Constitutional: Negative for fever and chills.  HENT: Positive for dental problem. Negative for drooling, ear discharge, ear pain, facial swelling, sore throat and trouble swallowing.   Eyes: Negative for pain  and visual disturbance.  Gastrointestinal: Negative for nausea, vomiting and abdominal pain.  Musculoskeletal: Negative for neck pain and neck stiffness.  Skin: Negative for rash.  Neurological: Negative for headaches.      Allergies  Haldol and Nubain  Home Medications   Prior to Admission medications   Medication Sig Start Date End Date Taking? Authorizing Provider  naproxen (NAPROSYN) 250 MG tablet Take 1 tablet (250 mg total) by mouth 2 (two) times daily with a meal. 12/10/15   Everlene Farrier, PA-C  penicillin v potassium (VEETID) 500 MG tablet Take 1 tablet (500 mg total) by mouth 4 (four) times daily. 12/10/15   Everlene Farrier, PA-C  traMADol (ULTRAM) 50 MG tablet Take 1 tablet (50 mg total) by mouth every 6 (six) hours as needed. Patient not taking: Reported on 12/10/2015 11/09/14   Tammy Triplett, PA-C   BP 113/72 mmHg  Pulse 66  Temp(Src) 98.9 F (37.2 C) (Oral)  Resp 18  Ht  (1.702 m)  Wt 68.04 kg  BMI 23.49 kg/m2  SpO2 98% Physical Exam  Constitutional: She appears well-developed and well-nourished. No distress.  Non-toxic appearing.   HENT:  Head: Normocephalic and atraumatic.  Right Ear: External ear normal.  Left Ear: External ear normal.  Mouth/Throat: Oropharynx is clear and moist. No oropharyngeal exudate.  Tenderness to upper front  teeth. No evidence of abscess. Generally very poor dentition. No discharge from the mouth. No facial swelling.  Uvula is midline without edema. Soft palate rises symmetrically. No tonsillar hypertrophy or exudates. Tongue protrusion is normal. No trismus. Bilateral tympanic membranes are pearly-gray without erythema or loss of landmarks.   Eyes: Conjunctivae are normal. Pupils are equal, round, and reactive to light. Right eye exhibits no discharge. Left eye exhibits no discharge.  Neck: Normal range of motion. Neck supple. No JVD present. No tracheal deviation present.  Cardiovascular: Normal rate, regular rhythm, normal heart  sounds and intact distal pulses.   Pulmonary/Chest: Effort normal and breath sounds normal. No respiratory distress.  Lymphadenopathy:    She has no cervical adenopathy.  Neurological: She is alert. Coordination normal.  Skin: Skin is warm and dry. No rash noted. She is not diaphoretic. No erythema. No pallor.  Psychiatric: She has a normal mood and affect. Her behavior is normal.  Nursing note and vitals reviewed.   ED Course  Procedures (including critical care time) Labs Review Labs Reviewed - No data to display  Imaging Review No results found.    EKG Interpretation None      Filed Vitals:   12/10/15 1336  BP: 113/72  Pulse: 66  Temp: 98.9 F (37.2 C)  TempSrc: Oral  Resp: 18  Height: 5\' 7"  (1.702 m)  Weight: 68.04 kg  SpO2: 98%     MDM   Meds given in ED:  Medications - No data to display  New Prescriptions   NAPROXEN (NAPROSYN) 250 MG TABLET    Take 1 tablet (250 mg total) by mouth 2 (two) times daily with a meal.   PENICILLIN V POTASSIUM (VEETID) 500 MG TABLET    Take 1 tablet (500 mg total) by mouth 4 (four) times daily.    Final diagnoses:  Pain, dental   This is a 55 y.o. female Who presents to the emergency department complaining of upper dental pain ongoing for several months that is worsened over the past several weeks. Patient complains of bilateral upper dental pain. She reports [facility]. Patient with toothache.  No gross abscess.  Exam unconcerning for Ludwig's angina or spread of infection.  Will treat with penicillin and pain medicine.  Urged patient [facility]. I advised the patient to follow-up with their primary care provider this week. I advised the patient to return to the emergency department with new or worsening symptoms or new concerns. The patient verbalized understanding and agreement with plan.      Everlene FarrierWilliam Tariyah Pendry, PA-C 12/10/15 1539  Glynn OctaveStephen Rancour,  MD 12/10/15 [phone]

## 2016-12-16 ENCOUNTER — Encounter (HOSPITAL_COMMUNITY): Payer: Self-pay

## 2016-12-16 ENCOUNTER — Emergency Department (HOSPITAL_COMMUNITY)
Admission: EM | Admit: 2016-12-16 | Discharge: 2016-12-16 | Disposition: A | Discharge status: Home / Self Care | Payer: Self-pay | Attending: Emergency Medicine | Admitting: Emergency Medicine

## 2016-12-16 DIAGNOSIS — R1084 Generalized abdominal pain: Secondary | ICD-10-CM

## 2016-12-16 DIAGNOSIS — M6281 Muscle weakness (generalized): Secondary | ICD-10-CM | POA: Insufficient documentation

## 2016-12-16 DIAGNOSIS — F17210 Nicotine dependence, cigarettes, uncomplicated: Secondary | ICD-10-CM | POA: Insufficient documentation

## 2016-12-16 DIAGNOSIS — Z9049 Acquired absence of other specified parts of digestive tract: Secondary | ICD-10-CM | POA: Insufficient documentation

## 2016-12-16 DIAGNOSIS — F1123 Opioid dependence with withdrawal: Secondary | ICD-10-CM | POA: Insufficient documentation

## 2016-12-16 DIAGNOSIS — R112 Nausea with vomiting, unspecified: Secondary | ICD-10-CM | POA: Insufficient documentation

## 2016-12-16 DIAGNOSIS — F1193 Opioid use, unspecified with withdrawal: Secondary | ICD-10-CM

## 2016-12-16 LAB — CBC WITH DIFFERENTIAL/PLATELET
Basophils Absolute: 0 10*3/uL (ref 0.0–0.1)
Basophils Relative: 0 %
Eosinophils Absolute: 0 10*3/uL (ref 0.0–0.7)
Eosinophils Relative: 0 %
HCT: 43 % (ref 36.0–46.0)
HEMOGLOBIN: 14.5 g/dL (ref 12.0–15.0)
LYMPHS PCT: 11 %
Lymphs Abs: 1.4 10*3/uL (ref 0.7–4.0)
MCH: 30.5 pg (ref 26.0–34.0)
MCHC: 33.7 g/dL (ref 30.0–36.0)
MCV: 90.3 fL (ref 78.0–100.0)
MONO ABS: 0.5 10*3/uL (ref 0.1–1.0)
Monocytes Relative: 4 %
NEUTROS ABS: 11.2 10*3/uL — AB (ref 1.7–7.7)
Neutrophils Relative %: 85 %
Platelets: 272 10*3/uL (ref 150–400)
RBC: 4.76 MIL/uL (ref 3.87–5.11)
RDW: 12.6 % (ref 11.5–15.5)
WBC: 13.1 10*3/uL — ABNORMAL HIGH (ref 4.0–10.5)

## 2016-12-16 LAB — COMPREHENSIVE METABOLIC PANEL
ALT: 49 U/L (ref 14–54)
AST: 67 U/L — ABNORMAL HIGH (ref 15–41)
Albumin: 3.7 g/dL (ref 3.5–5.0)
Alkaline Phosphatase: 78 U/L (ref 38–126)
Anion gap: 8 (ref 5–15)
BUN: 7 mg/dL (ref 6–20)
CHLORIDE: 104 mmol/L (ref 101–111)
CO2: 25 mmol/L (ref 22–32)
CREATININE: 0.56 mg/dL (ref 0.44–1.00)
Calcium: 8.8 mg/dL — ABNORMAL LOW (ref 8.9–10.3)
Glucose, Bld: 93 mg/dL (ref 65–99)
POTASSIUM: 3.2 mmol/L — AB (ref 3.5–5.1)
SODIUM: 137 mmol/L (ref 135–145)
Total Bilirubin: 0.7 mg/dL (ref 0.3–1.2)
Total Protein: 7.5 g/dL (ref 6.5–8.1)

## 2016-12-16 LAB — LIPASE, BLOOD: LIPASE: 167 U/L — AB (ref 11–51)

## 2016-12-16 LAB — ETHANOL

## 2016-12-16 MED ORDER — DICYCLOMINE HCL 10 MG PO CAPS
20.0000 mg | ORAL_CAPSULE | Freq: Once | ORAL | Status: AC
  Administered 2016-12-16: 20 mg via ORAL
  Filled 2016-12-16: qty 2

## 2016-12-16 MED ORDER — ONDANSETRON 4 MG PO TBDP: 4.0000 mg | ORAL_TABLET | Freq: Three times a day (TID) | ORAL | 0 refills | Status: DC | PRN

## 2016-12-16 MED ORDER — METHOCARBAMOL 500 MG PO TABS: 500.0000 mg | ORAL_TABLET | Freq: Three times a day (TID) | ORAL | 0 refills | Status: DC | PRN

## 2016-12-16 MED ORDER — METHOCARBAMOL 500 MG PO TABS
500.0000 mg | ORAL_TABLET | Freq: Once | ORAL | Status: AC
  Administered 2016-12-16: 500 mg via ORAL
  Filled 2016-12-16: qty 1

## 2016-12-16 MED ORDER — ONDANSETRON HCL 4 MG/2ML IJ SOLN
4.0000 mg | Freq: Once | INTRAMUSCULAR | Status: AC
  Administered 2016-12-16: 4 mg via INTRAVENOUS
  Filled 2016-12-16: qty 2

## 2016-12-16 MED ORDER — CLONIDINE HCL 0.1 MG PO TABS
0.1000 mg | ORAL_TABLET | Freq: Once | ORAL | Status: AC
  Administered 2016-12-16: 0.1 mg via ORAL
  Filled 2016-12-16: qty 1

## 2016-12-16 MED ORDER — SODIUM CHLORIDE 0.9 % IV BOLUS (SEPSIS)
500.0000 mL | Freq: Once | INTRAVENOUS | Status: AC
  Administered 2016-12-16: 500 mL via INTRAVENOUS

## 2016-12-16 MED ORDER — DICYCLOMINE HCL 20 MG PO TABS: 20.0000 mg | ORAL_TABLET | Freq: Three times a day (TID) | ORAL | 0 refills | Status: DC

## 2016-12-16 NOTE — ED Notes (Signed)
Pt reports that she goes to methadone clinic in [place] daily and hasnt been in several days due to finances

## 2016-12-16 NOTE — ED Triage Notes (Signed)
Pt on methadone and ran out.  She took a friend's suboxone at 0700 this morning.  Reports vomiting and cramping in legs since then.  Pt irritable and restless at this time.

## 2016-12-16 NOTE — ED Provider Notes (Signed)
Emergency Department Provider Note   I have reviewed the triage vital signs and the nursing notes.   HISTORY  Chief Complaint Emesis   HPI Sydney [name] is a 56 y.o. female with PMH of anxiety, chronic back pain, Hep C, and opiate dependence on Methadone for the last year (110 mg daily at [place] [place] [facility]) presents to the emergency department for evaluation of acute onset nausea, abdominal pain, muscle spasms. The patient has not been able to get her methadone for the past 2 days. She states that there is a fee every day for the clinic and she became desperate and took 1 strip of her friend's Suboxone. Shortly afterwards she developed the above-described symptoms. She denies any chest pain. She reports frequent muscle twitching and discomfort along with severe nausea and vomiting. She called EMS for assistance. No fever or chills. No radiation of pain.    Past Medical History:  Diagnosis Date  . Anxiety   . Back pain   . C. difficile colitis   . Chronic back pain   . Depression   . Hepatitis C   . Opiate dependence (HCC)    detox 06/2012  . Sciatic pain     Patient Active Problem List   Diagnosis Date Noted  . 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 Suicide attempt (HCC) 06/15/2013  . Tinnitus 06/15/2013  . Abdominal pain 06/15/2013  . Chronic back pain 06/15/2013  . Depression 06/15/2013  . Anxiety 06/15/2013  . Colitis 06/02/2013  . Anxiety disorder 06/20/2012  . Opiate dependence (HCC) 06/20/2012    Past Surgical History:  Procedure Laterality Date  . ABDOMINAL HYSTERECTOMY    . APPENDECTOMY    . CHOLECYSTECTOMY    . LEFT OOPHORECTOMY      Current Outpatient Rx  . Order #: [id number] Class: Historical Med  . Order #: [id number] Class: Print  . Order #: [id number] Class: Print  . Order #: [id number] Class: Print    Allergies Haldol [haloperidol lactate] and Nubain [nalbuphine hcl]  Family History  Problem Relation Age of Onset  . Adopted: Yes    Social  History Social History  Substance Use Topics  . Smoking status: Current Every Day Smoker    Packs/day: 1.00    Types: Cigarettes  . Smokeless tobacco: Never Used  . Alcohol use No    Review of Systems  Constitutional: No fever/chills. Positive total body pain and spasm.  Eyes: No visual changes. ENT: No sore throat. Cardiovascular: Denies chest pain. Respiratory: Denies shortness of breath. Gastrointestinal: Positive abdominal pain. Positive nausea and vomiting.  No diarrhea.  No constipation. Genitourinary: Negative for dysuria. Musculoskeletal: Negative for back pain. Skin: Negative for rash. Neurological: Negative for headaches, focal weakness or numbness.  10-point ROS otherwise negative.  ____________________________________________   PHYSICAL EXAM:  VITAL SIGNS: ED Triage Vitals  Enc Vitals Group     BP 12/16/16 1014 95/75     Pulse Rate 12/16/16 1014 65     Resp 12/16/16 1014 18     Temp 12/16/16 1014 97.5 F (36.4 C)     Temp Source 12/16/16 1014 Oral     SpO2 12/16/16 1014 98 %     Weight 12/16/16 1013 160 lb (72.6 kg)     Height 12/16/16 1013 5\' 7"  (1.702 m)     Pain Score 12/16/16 1012 6   Constitutional: Alert and oriented. Patient appears uncomfortable and frequently moving and twisting in bed.  Eyes: Conjunctivae are normal.  Head: Atraumatic. Nose: No congestion/rhinnorhea. Mouth/Throat:  Mucous membranes are slightly dry.  Neck: No stridor.  Cardiovascular: Normal rate, regular rhythm. Good peripheral circulation. Grossly normal heart sounds.   Respiratory: Normal respiratory effort.  No retractions. Lungs CTAB. Gastrointestinal: Soft with mild diffuse tenderness to palpation, nothing focal. No rebound or guarding. No distention.  Musculoskeletal: No lower extremity tenderness nor edema. No gross deformities of extremities. Neurologic:  Normal speech and language. No gross focal neurologic deficits are appreciated.  Skin:  Skin is warm, dry and  intact. No rash noted.  ____________________________________________   LABS (all labs ordered are listed, but only abnormal results are displayed)  Labs Reviewed  COMPREHENSIVE METABOLIC PANEL - Abnormal; Notable for the following:       Result Value   Potassium 3.2 (*)    Calcium 8.8 (*)    AST 67 (*)    All other components within normal limits  LIPASE, BLOOD - Abnormal; Notable for the following:    Lipase 167 (*)    All other components within normal limits  CBC WITH DIFFERENTIAL/PLATELET - Abnormal; Notable for the following:    WBC 13.1 (*)    Neutro Abs 11.2 (*)    All other components within normal limits  ETHANOL  URINALYSIS, ROUTINE W REFLEX MICROSCOPIC  RAPID URINE DRUG SCREEN, HOSP PERFORMED   ____________________________________________  EKG   EKG Interpretation  Date/Time:  Friday December 16 2016 11:22:57 EDT Ventricular Rate:  65 PR Interval:    QRS Duration: 81 QT Interval:  426 QTC Calculation: 443 R Axis:   81 Text Interpretation:  Sinus rhythm No STEMI.  Confirmed by Alona Bene [id number]) on 12/16/2016 11:46:20 AM      ____________________________________________   PROCEDURES  Procedure(s) performed:   Procedures  None ____________________________________________   INITIAL IMPRESSION / ASSESSMENT AND PLAN / ED COURSE  Pertinent labs & imaging results that were available during my care of the patient were reviewed by me and considered in my medical decision making (see chart for details).  Patient presents to the emergency department for evaluation of acute onset opiate withdrawal symptoms. She took a friend's Suboxone this morning in the setting of chronic methadone use. The patient's symptoms are consistent with acute opiate withdrawal. She is diffusely tender on abdominal exam but no rebound or guarding. She seems uncomfortable but no active vomiting in the emergency department. Plan for opiate withdrawal medications along with IV fluids.  Will obtain lab work and reassess afterwards.   01:00 PM Patient is feeling much better after medication in the ED and IVF. Plan for PCP follow up for opiate withdrawal. No evidence of acute intra-abdominal process on exam. Discharged home with Bentyl, Robaxin, and Zofran. Will f/u with methadone clinic for additional medication doses. She will call for a ride home.   At this time, I do not feel there is any life-threatening condition present. I have reviewed and discussed all results (EKG, imaging, lab, urine as appropriate), exam findings with patient. I have reviewed nursing notes and appropriate previous records.  I feel the patient is safe to be discharged home without further emergent workup. Discussed usual and customary return precautions. Patient and family (if present) verbalize understanding and are comfortable with this plan.  Patient will follow-up with their primary care provider. If they do not have a primary care provider, information for follow-up has been provided to them. All questions have been answered.  ____________________________________________  FINAL CLINICAL IMPRESSION(S) / ED DIAGNOSES  Final diagnoses:  Non-intractable vomiting with nausea, unspecified vomiting type  Generalized abdominal pain  Opiate withdrawal (HCC)     MEDICATIONS GIVEN DURING THIS VISIT:  Medications  sodium chloride 0.9 % bolus 500 mL (500 mLs Intravenous New Bag/Given 12/16/16 1105)  cloNIDine (CATAPRES) tablet 0.1 mg (0.1 mg Oral Given 12/16/16 1106)  methocarbamol (ROBAXIN) tablet 500 mg (500 mg Oral Given 12/16/16 1106)  dicyclomine (BENTYL) capsule 20 mg (20 mg Oral Given 12/16/16 1106)  ondansetron (ZOFRAN) injection 4 mg (4 mg Intravenous Given 12/16/16 1106)     NEW OUTPATIENT MEDICATIONS STARTED DURING THIS VISIT:  New Prescriptions   DICYCLOMINE (BENTYL) 20 MG TABLET    Take 1 tablet (20 mg total) by mouth 3 (three) times daily before meals.   METHOCARBAMOL (ROBAXIN) 500 MG  TABLET    Take 1 tablet (500 mg total) by mouth every 8 (eight) hours as needed for muscle spasms.   ONDANSETRON (ZOFRAN ODT) 4 MG DISINTEGRATING TABLET    Take 1 tablet (4 mg total) by mouth every 8 (eight) hours as needed for nausea or vomiting.      Note:  This document was prepared using Dragon voice recognition software and may include unintentional dictation errors.  Alona BeneJoshua Kynsli Haapala, MD Emergency Medicine   Broghan Pannone, Arlyss RepressJoshua G, MD 12/16/16 [phone]

## 2016-12-16 NOTE — Discharge Instructions (Signed)
You were seen in the ED today with symptoms of opiate withdrawal. Take only your methadone as prescribed. Follow up with your PCP in the coming days to review your ED visit. I have prescribed several medications to take as directed.   Return to the ED with any sudden worsening pain, fever, chills, or other concerning symptoms.

## 2016-12-16 NOTE — ED Notes (Signed)
Pt states unable to give urine at this time 

## 2019-02-12 ENCOUNTER — Other Ambulatory Visit: Payer: Self-pay

## 2019-02-12 ENCOUNTER — Emergency Department (HOSPITAL_COMMUNITY)
Admission: EM | Admit: 2019-02-12 | Discharge: 2019-02-12 | Disposition: A | Discharge status: Home / Self Care | Payer: Self-pay | Attending: Emergency Medicine | Admitting: Emergency Medicine

## 2019-02-12 ENCOUNTER — Encounter (HOSPITAL_COMMUNITY): Payer: Self-pay | Admitting: Emergency Medicine

## 2019-02-12 DIAGNOSIS — F17210 Nicotine dependence, cigarettes, uncomplicated: Secondary | ICD-10-CM | POA: Insufficient documentation

## 2019-02-12 DIAGNOSIS — F1193 Opioid use, unspecified with withdrawal: Secondary | ICD-10-CM

## 2019-02-12 DIAGNOSIS — F1920 Other psychoactive substance dependence, uncomplicated: Secondary | ICD-10-CM

## 2019-02-12 DIAGNOSIS — Z79899 Other long term (current) drug therapy: Secondary | ICD-10-CM | POA: Insufficient documentation

## 2019-02-12 DIAGNOSIS — F1123 Opioid dependence with withdrawal: Secondary | ICD-10-CM | POA: Insufficient documentation

## 2019-02-12 DIAGNOSIS — F1910 Other psychoactive substance abuse, uncomplicated: Secondary | ICD-10-CM | POA: Insufficient documentation

## 2019-02-12 LAB — COMPREHENSIVE METABOLIC PANEL
ALT: 64 U/L — ABNORMAL HIGH (ref 0–44)
AST: 74 U/L — ABNORMAL HIGH (ref 15–41)
Albumin: 4.5 g/dL (ref 3.5–5.0)
Alkaline Phosphatase: 52 U/L (ref 38–126)
Anion gap: 9 (ref 5–15)
BUN: 8 mg/dL (ref 6–20)
CO2: 25 mmol/L (ref 22–32)
Calcium: 9.4 mg/dL (ref 8.9–10.3)
Chloride: 103 mmol/L (ref 98–111)
Creatinine, Ser: 0.59 mg/dL (ref 0.44–1.00)
GFR calc Af Amer: >60 mL/min (ref >60)
GFR calc non Af Amer: >60 mL/min (ref >60)
Glucose, Bld: 133 mg/dL — ABNORMAL HIGH (ref 70–99)
Potassium: 3.1 mmol/L — ABNORMAL LOW (ref 3.5–5.1)
Sodium: 137 mmol/L (ref 135–145)
Total Bilirubin: 0.9 mg/dL (ref 0.3–1.2)
Total Protein: 8.9 g/dL — ABNORMAL HIGH (ref 6.5–8.1)

## 2019-02-12 LAB — CBC
HCT: 47.2 % — ABNORMAL HIGH (ref 36.0–46.0)
Hemoglobin: 15.5 g/dL — ABNORMAL HIGH (ref 12.0–15.0)
MCH: 30.7 pg (ref 26.0–34.0)
MCHC: 32.8 g/dL (ref 30.0–36.0)
MCV: 93.5 fL (ref 80.0–100.0)
Platelets: 340 10*3/uL (ref 150–400)
RBC: 5.05 MIL/uL (ref 3.87–5.11)
RDW: 12.6 % (ref 11.5–15.5)
WBC: 11.2 10*3/uL — ABNORMAL HIGH (ref 4.0–10.5)
nRBC: 0 % (ref 0.0–0.2)

## 2019-02-12 LAB — LIPASE, BLOOD: Lipase: 31 U/L (ref 11–51)

## 2019-02-12 MED ORDER — LOPERAMIDE HCL 2 MG PO CAPS
4.0000 mg | ORAL_CAPSULE | Freq: Once | ORAL | Status: AC
  Administered 2019-02-12: 16:00:00 4 mg via ORAL
  Filled 2019-02-12: qty 2

## 2019-02-12 MED ORDER — ONDANSETRON HCL 4 MG PO TABS: 4.0000 mg | ORAL_TABLET | Freq: Four times a day (QID) | ORAL | 0 refills | Status: DC

## 2019-02-12 MED ORDER — DICYCLOMINE HCL 20 MG PO TABS: 20.0000 mg | ORAL_TABLET | Freq: Four times a day (QID) | ORAL | 0 refills | Status: DC | PRN

## 2019-02-12 MED ORDER — CLONIDINE HCL 0.1 MG PO TABS: 0.1000 mg | ORAL_TABLET | Freq: Two times a day (BID) | ORAL | 0 refills | Status: DC

## 2019-02-12 MED ORDER — KETOROLAC TROMETHAMINE 30 MG/ML IJ SOLN
15.0000 mg | Freq: Once | INTRAMUSCULAR | Status: AC
  Administered 2019-02-12: 16:00:00 15 mg via INTRAVENOUS
  Filled 2019-02-12: qty 1

## 2019-02-12 MED ORDER — DICYCLOMINE HCL 10 MG/ML IM SOLN
20.0000 mg | Freq: Once | INTRAMUSCULAR | Status: AC
  Administered 2019-02-12: 16:00:00 20 mg via INTRAMUSCULAR
  Filled 2019-02-12: qty 2

## 2019-02-12 MED ORDER — IBUPROFEN 600 MG PO TABS: 600.0000 mg | ORAL_TABLET | Freq: Four times a day (QID) | ORAL | 0 refills | Status: DC | PRN

## 2019-02-12 MED ORDER — ONDANSETRON HCL 4 MG/2ML IJ SOLN
4.0000 mg | Freq: Once | INTRAMUSCULAR | Status: AC
  Administered 2019-02-12: 4 mg via INTRAVENOUS
  Filled 2019-02-12: qty 2

## 2019-02-12 MED ORDER — CLONIDINE HCL 0.1 MG PO TABS
0.1000 mg | ORAL_TABLET | Freq: Once | ORAL | Status: AC
  Administered 2019-02-12: 16:00:00 0.1 mg via ORAL
  Filled 2019-02-12: qty 1

## 2019-02-12 MED ORDER — POTASSIUM CHLORIDE CRYS ER 20 MEQ PO TBCR
60.0000 meq | EXTENDED_RELEASE_TABLET | Freq: Once | ORAL | Status: AC
  Administered 2019-02-12: 17:00:00 60 meq via ORAL
  Filled 2019-02-12: qty 3

## 2019-02-12 MED ORDER — SODIUM CHLORIDE 0.9 % IV BOLUS
1000.0000 mL | Freq: Once | INTRAVENOUS | Status: AC
  Administered 2019-02-12: 16:00:00 1000 mL via INTRAVENOUS

## 2019-02-12 MED ORDER — LOPERAMIDE HCL 2 MG PO CAPS: 2.0000 mg | ORAL_CAPSULE | Freq: Four times a day (QID) | ORAL | 0 refills | Status: DC | PRN

## 2019-02-12 NOTE — ED Notes (Signed)
Per registration, patient left in a vehicle with someone and came back. States patient was gone for approximately 10 minutes.

## 2019-02-12 NOTE — ED Triage Notes (Addendum)
Patient brought in by EMS for complaint of withdrawal from methadone. States last dose was Saturday. Complaining of vomiting and diarrhea since yesterday. 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 States she recently used xanax and cocaine "a few days ago."

## 2019-02-24 NOTE — ED Provider Notes (Signed)
[facility] EMERGENCY DEPARTMENT Provider Note   CSN: [id number] Arrival date & time: 02/12/19  1127     History   Chief Complaint Chief Complaint  Patient presents with  . Withdrawal    HPI Sydney [name] is a 58 y.o. female.     HPI   57y with what she thinks is withdrawal symptoms of methadone.  She states she last used 2 days ago.  She cannot afford it which is why she missed.  She says she feels anxious.  Very uncomfortable.  Nauseated.  Difficulty sleeping.  Body aches.  Abdominal cramping.  No fevers or chills.  Past Medical History:  Diagnosis Date  . Anxiety   . Back pain   . C. difficile colitis   . Chronic back pain   . Depression   . Hepatitis C   . Opiate dependence (HCC)    detox 06/2012  . Sciatic pain     Patient Active Problem List   Diagnosis Date Noted  . 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 Suicide attempt (HCC) 06/15/2013  . Tinnitus 06/15/2013  . Abdominal pain 06/15/2013  . Chronic back pain 06/15/2013  . Depression 06/15/2013  . Anxiety 06/15/2013  . Colitis 06/02/2013  . Anxiety disorder 06/20/2012  . Opiate dependence (HCC) 06/20/2012    Past Surgical History:  Procedure Laterality Date  . ABDOMINAL HYSTERECTOMY    . APPENDECTOMY    . CHOLECYSTECTOMY    . LEFT OOPHORECTOMY       OB History   No obstetric history on file.     Home Medications    Prior to Admission medications   Medication Sig Start Date End Date Taking? Authorizing Provider  methadone (DOLOPHINE) 10 MG/ML solution Take 120 mg by mouth daily.   Yes [provider]  cloNIDine (CATAPRES) 0.1 MG tablet Take 1 tablet (0.1 mg total) by mouth 2 (two) times daily. 02/12/19   Raeford RazorKohut, Miguelangel Korn, MD  dicyclomine (BENTYL) 20 MG tablet Take 1 tablet (20 mg total) by mouth every 6 (six) hours as needed for spasms. 02/12/19   Raeford RazorKohut, Aariz Maish, MD  ibuprofen (ADVIL) 600 MG tablet Take 1 tablet (600 mg total) by mouth every 6 (six) hours as needed. 02/12/19   Raeford RazorKohut,  Kiira Brach, MD  loperamide (IMODIUM) 2 MG capsule Take 1 capsule (2 mg total) by mouth 4 (four) times daily as needed for diarrhea or loose stools. 02/12/19   Raeford RazorKohut, Lulani Bour, MD  ondansetron (ZOFRAN) 4 MG tablet Take 1 tablet (4 mg total) by mouth every 6 (six) hours. 02/12/19   Raeford RazorKohut, Cregg Jutte, MD   Family History Family History  Adopted: Yes   Social History Social History   Tobacco Use  . Smoking status: Current Every Day Smoker    Packs/day: 1.00    Types: Cigarettes  . Smokeless tobacco: Never Used  Substance Use Topics  . Alcohol use: No  . Drug use: Yes    Types: Marijuana, Cocaine    Comment: xanax and cocaine recently    Allergies   Haldol [haloperidol lactate] and Nubain [nalbuphine hcl]  Review of Systems Review of Systems All systems reviewed and negative, other than as noted in HPI.  Physical Exam Updated Vital Signs BP 129/81 (BP Location: Right Arm)   Pulse 77   Temp 98.1 F (36.7 C) (Oral)   Resp 16   Ht 5\' 7"  (1.702 m)   Wt 63.5 kg   SpO2 99%   BMI 21.93 kg/m   Physical Exam Vitals signs  and nursing note reviewed.  Constitutional:      General: She is not in acute distress.    Appearance: She is well-developed.  HENT:     Head: Normocephalic and atraumatic.  Eyes:     General:        Right eye: No discharge.        Left eye: No discharge.     Conjunctiva/sclera: Conjunctivae normal.  Neck:     Musculoskeletal: Neck supple.  Cardiovascular:     Rate and Rhythm: Normal rate and regular rhythm.     Heart sounds: Normal heart sounds. No murmur. No friction rub. No gallop.   Pulmonary:     Effort: Pulmonary effort is normal. No respiratory distress.     Breath sounds: Normal breath sounds.  Abdominal:     General: There is no distension.     Palpations: Abdomen is soft.     Tenderness: There is no abdominal tenderness.  Musculoskeletal:        General: No tenderness.  Skin:    General: Skin is warm and dry.  Neurological:     Mental  Status: She is alert.  Psychiatric:        Behavior: Behavior normal.        Thought Content: Thought content normal.     ED Treatments / Results  Labs (all labs ordered are listed, but only abnormal results are displayed) Labs Reviewed  COMPREHENSIVE METABOLIC PANEL - Abnormal; Notable for the following components:      Result Value   Potassium 3.1 (*)    Glucose, Bld 133 (*)    Total Protein 8.9 (*)    AST 74 (*)    ALT 64 (*)    All other components within normal limits  CBC - Abnormal; Notable for the following components:   WBC 11.2 (*)    Hemoglobin 15.5 (*)    HCT 47.2 (*)    All other components within normal limits  LIPASE, BLOOD   EKG None  Radiology No results found.  Procedures Procedures (including critical care time)  Medications Ordered in ED Medications  ketorolac (TORADOL) 30 MG/ML injection 15 mg (15 mg Intravenous Given 02/12/19 1559)  loperamide (IMODIUM) capsule 4 mg (4 mg Oral Given 02/12/19 1557)  ondansetron (ZOFRAN) injection 4 mg (4 mg Intravenous Given 02/12/19 1559)  cloNIDine (CATAPRES) tablet 0.1 mg (0.1 mg Oral Given 02/12/19 1557)  sodium chloride 0.9 % bolus 1,000 mL (0 mLs Intravenous Stopped 02/12/19 1807)  dicyclomine (BENTYL) injection 20 mg (20 mg Intramuscular Given 02/12/19 1558)  potassium chloride SA (K-DUR) CR tablet 60 mEq (60 mEq Oral Given 02/12/19 1714)     Initial Impression / Assessment and Plan / ED Course  I have reviewed the triage vital signs and the nursing notes.  Pertinent labs & imaging results that were available during my care of the patient were reviewed by me and considered in my medical decision making (see chart for details).       58 year old female with opiate dependency and withdrawal symptoms.  She seems somewhat uncomfortable, but not toxic.  She is afebrile hemodynamically stable.  I doubt alternative emergent process.  Plan symptomatic treatment.  She will be provided with a list of outpatient  resources.  Return precautions were discussed.  Final Clinical Impressions(s) / ED Diagnoses   Final diagnoses:  Drug dependency (HCC)  Opiate withdrawal [facility](HCC)    ED Discharge Orders         Ordered  dicyclomine (BENTYL) 20 MG tablet  Every 6 hours PRN     02/12/19 1727    loperamide (IMODIUM) 2 MG capsule  4 times daily PRN     02/12/19 1727    cloNIDine (CATAPRES) 0.1 MG tablet  2 times daily     02/12/19 1727    ondansetron (ZOFRAN) 4 MG tablet  Every 6 hours     02/12/19 1727    ibuprofen (ADVIL) 600 MG tablet  Every 6 hours PRN     02/12/19 1727           Virgel Manifold, MD 02/24/19 [phone]

## 2019-05-15 ENCOUNTER — Other Ambulatory Visit: Payer: Self-pay

## 2019-05-15 DIAGNOSIS — Z20822 Contact with and (suspected) exposure to covid-19: Secondary | ICD-10-CM

## 2019-05-16 ENCOUNTER — Telehealth: Payer: Self-pay | Admitting: *Deleted

## 2019-05-16 NOTE — Telephone Encounter (Signed)
Pt calling for covid results, active. Pt aware not resulted yet. 

## 2019-05-17 ENCOUNTER — Telehealth: Payer: Self-pay

## 2019-05-17 LAB — NOVEL CORONAVIRUS, NAA: SARS-CoV-2, NAA: NOT DETECTED

## 2019-05-17 NOTE — Telephone Encounter (Signed)
Pt. Given COVID 19 results, verbalizes understanding. Given My Chart help desk number.

## 2019-09-05 ENCOUNTER — Ambulatory Visit: Payer: Medicaid Other | Attending: Internal Medicine

## 2019-09-05 ENCOUNTER — Other Ambulatory Visit: Payer: Self-pay

## 2019-09-05 DIAGNOSIS — Z20822 Contact with and (suspected) exposure to covid-19: Secondary | ICD-10-CM | POA: Insufficient documentation

## 2019-09-06 LAB — NOVEL CORONAVIRUS, NAA: SARS-CoV-2, NAA: NOT DETECTED

## 2019-09-07 ENCOUNTER — Telehealth: Payer: Self-pay | Admitting: *Deleted

## 2019-09-07 NOTE — Telephone Encounter (Signed)
Reviewed negative Covid19 results with the patient. No further questions. 

## 2020-01-13 ENCOUNTER — Encounter (HOSPITAL_COMMUNITY): Payer: Self-pay

## 2020-01-13 ENCOUNTER — Emergency Department (HOSPITAL_COMMUNITY)
Admission: EM | Admit: 2020-01-13 | Discharge: 2020-01-14 | Disposition: A | Discharge status: Home / Self Care | Payer: Medicaid Other | Attending: Emergency Medicine | Admitting: Emergency Medicine

## 2020-01-13 DIAGNOSIS — F17210 Nicotine dependence, cigarettes, uncomplicated: Secondary | ICD-10-CM | POA: Insufficient documentation

## 2020-01-13 DIAGNOSIS — Y939 Activity, unspecified: Secondary | ICD-10-CM | POA: Insufficient documentation

## 2020-01-13 DIAGNOSIS — T50902A Poisoning by unspecified drugs, medicaments and biological substances, intentional self-harm, initial encounter: Secondary | ICD-10-CM

## 2020-01-13 DIAGNOSIS — Y929 Unspecified place or not applicable: Secondary | ICD-10-CM | POA: Insufficient documentation

## 2020-01-13 DIAGNOSIS — T481X1A Poisoning by skeletal muscle relaxants [neuromuscular blocking agents], accidental (unintentional), initial encounter: Secondary | ICD-10-CM | POA: Insufficient documentation

## 2020-01-13 DIAGNOSIS — R45851 Suicidal ideations: Secondary | ICD-10-CM

## 2020-01-13 DIAGNOSIS — Z20822 Contact with and (suspected) exposure to covid-19: Secondary | ICD-10-CM | POA: Insufficient documentation

## 2020-01-13 DIAGNOSIS — L259 Unspecified contact dermatitis, unspecified cause: Secondary | ICD-10-CM | POA: Insufficient documentation

## 2020-01-13 DIAGNOSIS — X58XXXA Exposure to other specified factors, initial encounter: Secondary | ICD-10-CM | POA: Insufficient documentation

## 2020-01-13 DIAGNOSIS — Y999 Unspecified external cause status: Secondary | ICD-10-CM | POA: Insufficient documentation

## 2020-01-13 LAB — CBC WITH DIFFERENTIAL/PLATELET
Abs Immature Granulocytes: 0.04 10*3/uL (ref 0.00–0.07)
Basophils Absolute: 0 10*3/uL (ref 0.0–0.1)
Basophils Relative: 0 %
Eosinophils Absolute: 0 10*3/uL (ref 0.0–0.5)
Eosinophils Relative: 0 %
HCT: 43.4 % (ref 36.0–46.0)
Hemoglobin: 14.7 g/dL (ref 12.0–15.0)
Immature Granulocytes: 0 %
Lymphocytes Relative: 10 %
Lymphs Abs: 1.2 10*3/uL (ref 0.7–4.0)
MCH: 31.3 pg (ref 26.0–34.0)
MCHC: 33.9 g/dL (ref 30.0–36.0)
MCV: 92.3 fL (ref 80.0–100.0)
Monocytes Absolute: 0.5 10*3/uL (ref 0.1–1.0)
Monocytes Relative: 4 %
Neutro Abs: 9.6 10*3/uL — ABNORMAL HIGH (ref 1.7–7.7)
Neutrophils Relative %: 86 %
Platelets: 374 10*3/uL (ref 150–400)
RBC: 4.7 MIL/uL (ref 3.87–5.11)
RDW: 12.4 % (ref 11.5–15.5)
WBC: 11.3 10*3/uL — ABNORMAL HIGH (ref 4.0–10.5)
nRBC: 0 % (ref 0.0–0.2)

## 2020-01-13 LAB — COMPREHENSIVE METABOLIC PANEL
ALT: 64 U/L — ABNORMAL HIGH (ref 0–44)
AST: 46 U/L — ABNORMAL HIGH (ref 15–41)
Albumin: 4.1 g/dL (ref 3.5–5.0)
Alkaline Phosphatase: 49 U/L (ref 38–126)
Anion gap: 10 (ref 5–15)
BUN: 9 mg/dL (ref 6–20)
CO2: 24 mmol/L (ref 22–32)
Calcium: 9.2 mg/dL (ref 8.9–10.3)
Chloride: 106 mmol/L (ref 98–111)
Creatinine, Ser: 0.43 mg/dL — ABNORMAL LOW (ref 0.44–1.00)
GFR calc Af Amer: >60 mL/min (ref >60)
GFR calc non Af Amer: >60 mL/min (ref >60)
Glucose, Bld: 146 mg/dL — ABNORMAL HIGH (ref 70–99)
Potassium: 3.4 mmol/L — ABNORMAL LOW (ref 3.5–5.1)
Sodium: 140 mmol/L (ref 135–145)
Total Bilirubin: 0.6 mg/dL (ref 0.3–1.2)
Total Protein: 7.7 g/dL (ref 6.5–8.1)

## 2020-01-13 LAB — ACETAMINOPHEN LEVEL
Acetaminophen (Tylenol), Serum: <10 ug/mL — ABNORMAL LOW (ref 10–30)
Acetaminophen (Tylenol), Serum: <10 ug/mL — ABNORMAL LOW (ref 10–30)

## 2020-01-13 LAB — ETHANOL: Alcohol, Ethyl (B): <10 mg/dL (ref <10)

## 2020-01-13 LAB — CBG MONITORING, ED: Glucose-Capillary: 118 mg/dL — ABNORMAL HIGH (ref 70–99)

## 2020-01-13 MED ORDER — LORAZEPAM 2 MG/ML IJ SOLN
1.0000 mg | Freq: Once | INTRAMUSCULAR | Status: AC
  Administered 2020-01-13: 1 mg via INTRAVENOUS
  Filled 2020-01-13: qty 1

## 2020-01-13 MED ORDER — NALOXONE HCL 0.4 MG/ML IJ SOLN
0.4000 mg | Freq: Once | INTRAMUSCULAR | Status: AC
  Administered 2020-01-13: 0.4 mg via INTRAVENOUS
  Filled 2020-01-13: qty 1

## 2020-01-13 NOTE — ED Provider Notes (Addendum)
[facility] EMERGENCY DEPARTMENT Provider Note   CSN: [id number] Arrival date & time: 01/13/20  1644     History Chief Complaint  Patient presents with  . Drug Overdose    Sydney [name] is a 59 y.o. female.  Patient brought in by EMS.  EMS was called by boyfriend.  Patient apparently took 10 baclofen tablets at 2-1/2 hours prior to arrival.  There was some vomiting.  Patient upon arrival seem drowsy did not want to answer questions.  She was somewhat restless.  Was moving all 4 extremities spontaneously.  Patient has a past medical history of depression.  Hepatitis C.  Opioid dependence.  Patient's had an abdominal hysterectomy.  Patient appears drowsy or least has eyes closed.  But moving all 4 extremities will not answer questions.  Poison control contacted        Past Medical History:  Diagnosis Date  . Anxiety   . Back pain   . C. difficile colitis   . Chronic back pain   . Depression   . Hepatitis C   . Opiate dependence (HCC)    detox 06/2012  . Sciatic pain     Patient Active Problem List   Diagnosis Date Noted  . 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 Suicide attempt (HCC) 06/15/2013  . Tinnitus 06/15/2013  . Abdominal pain 06/15/2013  . Chronic back pain 06/15/2013  . Depression 06/15/2013  . Anxiety 06/15/2013  . Colitis 06/02/2013  . Anxiety disorder 06/20/2012  . Opiate dependence (HCC) 06/20/2012    Past Surgical History:  Procedure Laterality Date  . ABDOMINAL HYSTERECTOMY    . APPENDECTOMY    . CHOLECYSTECTOMY    . LEFT OOPHORECTOMY       OB History   No obstetric history on file.     Family History  Adopted: Yes    Social History   Tobacco Use  . Smoking status: Current Every Day Smoker    Packs/day: 1.00    Types: Cigarettes  . Smokeless tobacco: Never Used  Substance Use Topics  . Alcohol use: No  . Drug use: Yes    Types: Marijuana, Cocaine    Comment: xanax and cocaine recently    Home Medications Prior to Admission  medications   Medication Sig Start Date End Date Taking? Authorizing Provider  cloNIDine (CATAPRES) 0.1 MG tablet Take 1 tablet (0.1 mg total) by mouth 2 (two) times daily. 02/12/19   Raeford Razor, MD  dicyclomine (BENTYL) 20 MG tablet Take 1 tablet (20 mg total) by mouth every 6 (six) hours as needed for spasms. 02/12/19   Raeford Razor, MD  ibuprofen (ADVIL) 600 MG tablet Take 1 tablet (600 mg total) by mouth every 6 (six) hours as needed. 02/12/19   Raeford Razor, MD  loperamide (IMODIUM) 2 MG capsule Take 1 capsule (2 mg total) by mouth 4 (four) times daily as needed for diarrhea or loose stools. 02/12/19   Raeford Razor, MD  methadone (DOLOPHINE) 10 MG/ML solution Take 120 mg by mouth daily.    [provider]  ondansetron (ZOFRAN) 4 MG tablet Take 1 tablet (4 mg total) by mouth every 6 (six) hours. 02/12/19   Raeford Razor, MD    Allergies    Haldol [haloperidol lactate] and Nubain [nalbuphine hcl]  Review of Systems   Review of Systems  Unable to perform ROS: Mental status change    Physical Exam Updated Vital Signs BP (!) 140/108   Pulse 78   Temp 98.5 F (36.9  C)   Resp (!) 23   Ht 1.702 m (5\' 7" )   Wt 67.2 kg   SpO2 98%   BMI 23.20 kg/m   Physical Exam Vitals and nursing note reviewed.  Constitutional:      General: She is not in acute distress.    Appearance: Normal appearance. She is well-developed.  HENT:     Head: Normocephalic and atraumatic.  Eyes:     Extraocular Movements: Extraocular movements intact.     Conjunctiva/sclera: Conjunctivae normal.     Pupils: Pupils are equal, round, and reactive to light.  Cardiovascular:     Rate and Rhythm: Normal rate and regular rhythm.     Heart sounds: No murmur heard.   Pulmonary:     Effort: Pulmonary effort is normal. No respiratory distress.     Breath sounds: Normal breath sounds.  Abdominal:     Palpations: Abdomen is soft.     Tenderness: There is no abdominal tenderness.  Musculoskeletal:         General: No swelling. Normal range of motion.     Cervical back: Neck supple.  Skin:    General: Skin is warm and dry.     Capillary Refill: Capillary refill takes less than 2 seconds.  Neurological:     Mental Status: She is alert.     Comments: Patient very drowsy upon presentation.  Moving all 4 extremities.     ED Results / Procedures / Treatments   Labs (all labs ordered are listed, but only abnormal results are displayed) Labs Reviewed  ACETAMINOPHEN LEVEL - Abnormal; Notable for the following components:      Result Value   Acetaminophen (Tylenol), Serum <10 (*)    All other components within normal limits  COMPREHENSIVE METABOLIC PANEL - Abnormal; Notable for the following components:   Potassium 3.4 (*)    Glucose, Bld 146 (*)    Creatinine, Ser 0.43 (*)    AST 46 (*)    ALT 64 (*)    All other components within normal limits  CBC WITH DIFFERENTIAL/PLATELET - Abnormal; Notable for the following components:   WBC 11.3 (*)    Neutro Abs 9.6 (*)    All other components within normal limits  ACETAMINOPHEN LEVEL - Abnormal; Notable for the following components:   Acetaminophen (Tylenol), Serum <10 (*)    All other components within normal limits  CBG MONITORING, ED - Abnormal; Notable for the following components:   Glucose-Capillary 118 (*)    All other components within normal limits  ETHANOL  RAPID URINE DRUG SCREEN, HOSP PERFORMED   Results for orders placed or performed during the hospital encounter of 01/13/20  Acetaminophen level  Result Value Ref Range   Acetaminophen (Tylenol), Serum <10 (L) 10 - 30 ug/mL  Ethanol  Result Value Ref Range   Alcohol, Ethyl (B) <10 <10 mg/dL  Comprehensive metabolic panel  Result Value Ref Range   Sodium 140 135 - 145 mmol/L   Potassium 3.4 (L) 3.5 - 5.1 mmol/L   Chloride 106 98 - 111 mmol/L   CO2 24 22 - 32 mmol/L   Glucose, Bld 146 (H) 70 - 99 mg/dL   BUN 9 6 - 20 mg/dL   Creatinine, Ser 03/15/20 (L) 0.44 - 1.00  mg/dL   Calcium 9.2 8.9 - 6.94 mg/dL   Total Protein 7.7 6.5 - 8.1 g/dL   Albumin 4.1 3.5 - 5.0 g/dL   AST 46 (H) 15 - 41 U/L   ALT  64 (H) 0 - 44 U/L   Alkaline Phosphatase 49 38 - 126 U/L   Total Bilirubin 0.6 0.3 - 1.2 mg/dL   GFR calc non Af Amer >60 >60 mL/min   GFR calc Af Amer >60 >60 mL/min   Anion gap 10 5 - 15  CBC with Differential/Platelet  Result Value Ref Range   WBC 11.3 (H) 4.0 - 10.5 K/uL   RBC 4.70 3.87 - 5.11 MIL/uL   Hemoglobin 14.7 12.0 - 15.0 g/dL   HCT 16.143.4 36 - 46 %   MCV 92.3 80.0 - 100.0 fL   MCH 31.3 26.0 - 34.0 pg   MCHC 33.9 30.0 - 36.0 g/dL   RDW 09.612.4 04.511.5 - 40.915.5 %   Platelets 374 150 - 400 K/uL   nRBC 0.0 0.0 - 0.2 %   Neutrophils Relative % 86 %   Neutro Abs 9.6 (H) 1.7 - 7.7 K/uL   Lymphocytes Relative 10 %   Lymphs Abs 1.2 0.7 - 4.0 K/uL   Monocytes Relative 4 %   Monocytes Absolute 0.5 0 - 1 K/uL   Eosinophils Relative 0 %   Eosinophils Absolute 0.0 0 - 0 K/uL   Basophils Relative 0 %   Basophils Absolute 0.0 0 - 0 K/uL   Immature Granulocytes 0 %   Abs Immature Granulocytes 0.04 0.00 - 0.07 K/uL  Acetaminophen level  Result Value Ref Range   Acetaminophen (Tylenol), Serum <10 (L) 10 - 30 ug/mL  CBG monitoring, ED  Result Value Ref Range   Glucose-Capillary 118 (H) 70 - 99 mg/dL   Comment 1 Notify RN    Comment 2 Document in Chart      EKG EKG Interpretation  Date/Time:  Monday January 13 2020 16:58:17 EDT Ventricular Rate:  73 PR Interval:    QRS Duration: 80 QT Interval:  399 QTC Calculation: 440 R Axis:   69 Text Interpretation: Sinus rhythm Nonspecific T abnormalities, lateral leads No significant change since last tracing Confirmed by Vanetta MuldersZackowski, Della Scrivener [phone]) on 01/13/2020 5:06:41 PM   Radiology No results found.  Procedures Procedures (including critical care time)  CRITICAL CARE Performed by: Vanetta MuldersScott Naeem Quillin Total critical care time: 35 minutes Critical care time was exclusive of separately billable procedures  and treating other patients. Critical care was necessary to treat or prevent imminent or life-threatening deterioration. Critical care was time spent personally by me on the following activities: development of treatment plan with patient and/or surrogate as well as nursing, discussions with consultants, evaluation of patient's response to treatment, examination of patient, obtaining history from patient or surrogate, ordering and performing treatments and interventions, ordering and review of laboratory studies, ordering and review of radiographic studies, pulse oximetry and re-evaluation of patient's condition.   Medications Ordered in ED Medications  LORazepam (ATIVAN) injection 1 mg (1 mg Intravenous Given 01/13/20 1709)  naloxone (NARCAN) injection 0.4 mg (0.4 mg Intravenous Given 01/13/20 1709)    ED Course  I have reviewed the triage vital signs and the nursing notes.  Pertinent labs & imaging results that were available during my care of the patient were reviewed by me and considered in my medical decision making (see chart for details).    MDM Rules/Calculators/A&P                          Patient with intentional overdose.  Patient reported taking 10 baclofen approximately 2 and half hours prior to arrival.  Patient does not want  answer much but she did tell me that it was intentional.  Sounds as if there was suicide intent.  In addition patient was given Narcan shortly after arrival.  Without any significant change.  Patient was moving all 4 extremities although very drowsy.  Patient had to be coaxed him to stay in the bed.  Patient very drowsy upon presentation.  Was given a dose of Ativan IV to prevent seizures which was concerned for the overdose of the medication.  Poison control was contacted.  They recommended 8 hours of observation Tylenol level and then a repeat Tylenol level.  Cardiac monitoring.   She did well.  Labs without significant abnormalities.  Urine drug screen  still pending.  Call level was not elevated.  Patient without any seizures or hypotension problems as concern from poison control.  In addition it appears that patient goes to the methadone clinic in [place].  So her methadone has been reordered.  Patient is medically cleared for evaluation by behavioral health.  Patient now awake and alert and following commands.  Nods yes to suicide attempt.  Patient IVC.  Final Clinical Impression(s) / ED Diagnoses Final diagnoses:  Intentional drug overdose, initial encounter [facility])  Suicidal ideation    Rx / DC Orders ED Discharge Orders    None       Vanetta Mulders, MD 01/14/20 [id number]    Vanetta Mulders, MD 01/14/20 [phone]

## 2020-01-13 NOTE — ED Notes (Signed)
Pt cleared by poison control.  

## 2020-01-13 NOTE — ED Triage Notes (Addendum)
EMS reports that pt took 10 Baclofen per boyfriend. Approximately 2 1/2 hours ago. Stated she doesn't want to be here to boyfriend. Pt will not answer questions in triage she looks at nurse and closes eyes. Very restless , throwing legs over siderails

## 2020-01-13 NOTE — ED Notes (Addendum)
Called and spoke with Verlon Au with [employer]; pt overdosed on 10 baclofen pills, unknown dosage; Verlon Au states pt needs an EKG, tylenol level at 1815; CBC, CMET; states pt needs to be observed for 6-8 hours; pt will possibly be lethargic, somnolent, have delirium agitation, hallucinations; hypotensive, bradycardic, if so hydrate and give atropine as needed; pt may have prolong QRS wave

## 2020-01-13 NOTE — ED Notes (Signed)
Patient's mother called stated that she was going to the [facility] to file IVC paperwork.

## 2020-01-14 ENCOUNTER — Other Ambulatory Visit: Payer: Self-pay

## 2020-01-14 ENCOUNTER — Encounter (HOSPITAL_COMMUNITY): Payer: Self-pay | Admitting: Psychiatry

## 2020-01-14 ENCOUNTER — Inpatient Hospital Stay (HOSPITAL_COMMUNITY)
Admission: AD | Admit: 2020-01-14 | Discharge: 2020-01-20 | DRG: 885 | Disposition: A | Discharge status: Home / Self Care | Payer: BLUE CROSS/BLUE SHIELD | Source: Intra-hospital | Attending: Psychiatry | Admitting: Psychiatry

## 2020-01-14 DIAGNOSIS — F1123 Opioid dependence with withdrawal: Secondary | ICD-10-CM | POA: Diagnosis present

## 2020-01-14 DIAGNOSIS — F1320 Sedative, hypnotic or anxiolytic dependence, uncomplicated: Secondary | ICD-10-CM | POA: Diagnosis present

## 2020-01-14 DIAGNOSIS — K5900 Constipation, unspecified: Secondary | ICD-10-CM | POA: Diagnosis present

## 2020-01-14 DIAGNOSIS — T428X2A Poisoning by antiparkinsonism drugs and other central muscle-tone depressants, intentional self-harm, initial encounter: Secondary | ICD-10-CM | POA: Diagnosis present

## 2020-01-14 DIAGNOSIS — K219 Gastro-esophageal reflux disease without esophagitis: Secondary | ICD-10-CM | POA: Diagnosis present

## 2020-01-14 DIAGNOSIS — F332 Major depressive disorder, recurrent severe without psychotic features: Principal | ICD-10-CM

## 2020-01-14 DIAGNOSIS — R109 Unspecified abdominal pain: Secondary | ICD-10-CM

## 2020-01-14 DIAGNOSIS — T50902A Poisoning by unspecified drugs, medicaments and biological substances, intentional self-harm, initial encounter: Secondary | ICD-10-CM | POA: Diagnosis present

## 2020-01-14 DIAGNOSIS — T402X5A Adverse effect of other opioids, initial encounter: Secondary | ICD-10-CM

## 2020-01-14 DIAGNOSIS — F17210 Nicotine dependence, cigarettes, uncomplicated: Secondary | ICD-10-CM | POA: Diagnosis present

## 2020-01-14 DIAGNOSIS — F419 Anxiety disorder, unspecified: Secondary | ICD-10-CM | POA: Diagnosis present

## 2020-01-14 DIAGNOSIS — G4700 Insomnia, unspecified: Secondary | ICD-10-CM | POA: Diagnosis present

## 2020-01-14 DIAGNOSIS — K5901 Slow transit constipation: Secondary | ICD-10-CM

## 2020-01-14 DIAGNOSIS — Z79899 Other long term (current) drug therapy: Secondary | ICD-10-CM

## 2020-01-14 DIAGNOSIS — Z20822 Contact with and (suspected) exposure to covid-19: Secondary | ICD-10-CM | POA: Diagnosis present

## 2020-01-14 LAB — SARS CORONAVIRUS 2 BY RT PCR (HOSPITAL ORDER, PERFORMED IN ~~LOC~~ HOSPITAL LAB): SARS Coronavirus 2: NEGATIVE

## 2020-01-14 LAB — CBG MONITORING, ED: Glucose-Capillary: 115 mg/dL — ABNORMAL HIGH (ref 70–99)

## 2020-01-14 MED ORDER — METHADONE HCL 10 MG PO TABS: 65.0000 mg | ORAL_TABLET | Freq: Every day | ORAL | Status: DC

## 2020-01-14 MED ORDER — ONDANSETRON 4 MG PO TBDP
4.0000 mg | ORAL_TABLET | Freq: Four times a day (QID) | ORAL | Status: AC | PRN
  Administered 2020-01-14 – 2020-01-17 (×4): 4 mg via ORAL
  Filled 2020-01-14 (×5): qty 1

## 2020-01-14 MED ORDER — CLONIDINE HCL 0.1 MG PO TABS: 0.1000 mg | ORAL_TABLET | Freq: Every day | ORAL | Status: DC

## 2020-01-14 MED ORDER — ACETAMINOPHEN 325 MG PO TABS
650.0000 mg | ORAL_TABLET | Freq: Four times a day (QID) | ORAL | Status: DC | PRN
  Administered 2020-01-15 – 2020-01-20 (×4): 650 mg via ORAL
  Filled 2020-01-14 (×5): qty 2

## 2020-01-14 MED ORDER — TRAZODONE HCL 50 MG PO TABS
50.0000 mg | ORAL_TABLET | Freq: Every evening | ORAL | Status: DC | PRN
  Administered 2020-01-14 – 2020-01-17 (×3): 50 mg via ORAL
  Filled 2020-01-14 (×4): qty 1

## 2020-01-14 MED ORDER — ALUM & MAG HYDROXIDE-SIMETH 200-200-20 MG/5ML PO SUSP: 30.0000 mL | ORAL | Status: DC | PRN

## 2020-01-14 MED ORDER — METHADONE HCL 10 MG/ML PO CONC: 120.0000 mg | Freq: Every day | ORAL | Status: DC

## 2020-01-14 MED ORDER — DULOXETINE HCL 30 MG PO CPEP
30.0000 mg | ORAL_CAPSULE | Freq: Every day | ORAL | Status: DC
  Administered 2020-01-14 – 2020-01-16 (×3): 30 mg via ORAL
  Filled 2020-01-14 (×6): qty 1

## 2020-01-14 MED ORDER — LOPERAMIDE HCL 2 MG PO CAPS: 2.0000 mg | ORAL_CAPSULE | ORAL | Status: AC | PRN

## 2020-01-14 MED ORDER — NAPROXEN 500 MG PO TABS
500.0000 mg | ORAL_TABLET | Freq: Two times a day (BID) | ORAL | Status: AC | PRN
  Administered 2020-01-17: 500 mg via ORAL
  Filled 2020-01-14: qty 1

## 2020-01-14 MED ORDER — CLONIDINE HCL 0.1 MG PO TABS
0.1000 mg | ORAL_TABLET | ORAL | Status: DC
  Filled 2020-01-14: qty 1

## 2020-01-14 MED ORDER — MAGNESIUM HYDROXIDE 400 MG/5ML PO SUSP: 30.0000 mL | Freq: Every day | ORAL | Status: DC | PRN

## 2020-01-14 MED ORDER — HYDROXYZINE HCL 25 MG PO TABS
25.0000 mg | ORAL_TABLET | Freq: Three times a day (TID) | ORAL | Status: DC | PRN
  Administered 2020-01-14 – 2020-01-18 (×3): 25 mg via ORAL
  Filled 2020-01-14 (×5): qty 1

## 2020-01-14 MED ORDER — METHOCARBAMOL 500 MG PO TABS
500.0000 mg | ORAL_TABLET | Freq: Three times a day (TID) | ORAL | Status: AC | PRN
  Administered 2020-01-14 – 2020-01-18 (×4): 500 mg via ORAL
  Filled 2020-01-14 (×5): qty 1

## 2020-01-14 MED ORDER — LORAZEPAM 1 MG PO TABS
1.0000 mg | ORAL_TABLET | Freq: Four times a day (QID) | ORAL | Status: DC | PRN
  Administered 2020-01-14 – 2020-01-15 (×3): 1 mg via ORAL
  Filled 2020-01-14 (×3): qty 1

## 2020-01-14 MED ORDER — PANTOPRAZOLE SODIUM 40 MG PO TBEC
40.0000 mg | DELAYED_RELEASE_TABLET | Freq: Every day | ORAL | Status: DC
  Administered 2020-01-14 – 2020-01-20 (×7): 40 mg via ORAL
  Filled 2020-01-14 (×9): qty 1
  Filled 2020-01-14: qty 7

## 2020-01-14 MED ORDER — DICYCLOMINE HCL 20 MG PO TABS
20.0000 mg | ORAL_TABLET | Freq: Four times a day (QID) | ORAL | Status: AC | PRN
  Administered 2020-01-15 – 2020-01-18 (×4): 20 mg via ORAL
  Filled 2020-01-14 (×4): qty 1

## 2020-01-14 MED ORDER — CLONIDINE HCL 0.1 MG PO TABS
0.1000 mg | ORAL_TABLET | Freq: Four times a day (QID) | ORAL | Status: DC
  Administered 2020-01-14 – 2020-01-15 (×2): 0.1 mg via ORAL
  Filled 2020-01-14 (×11): qty 1

## 2020-01-14 NOTE — ED Notes (Signed)
Pt ambulatory to restroom
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
## 2020-01-14 NOTE — ED Notes (Signed)
TTS in progress 

## 2020-01-14 NOTE — Progress Notes (Signed)
Psychoeducational Group Note  Date:  01/14/2020 Time:  2029  Group Topic/Focus:  Wrap-Up Group:   The focus of this group is to help patients review their daily goal of treatment and discuss progress on daily workbooks.  Participation Level: Did Not Attend  Participation Quality:  Not Applicable  Affect:  Not Applicable  Cognitive:  Not Applicable  Insight:  Not Applicable  Engagement in Group: Not Applicable  Additional Comments:  The patient did not attend group.   Hazle Coca S 01/14/2020, 8:29 PM

## 2020-01-14 NOTE — ED Notes (Signed)
TTS Clinician attempted assessment. Amil Amen, NT, attempted to arouse patient multiple times. Patient was did not wake up. Casimiro Needle, RN, informed of attempted assessment, patient asleep and unable to participate at this time. TTS will attempt at later time.
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
## 2020-01-14 NOTE — BH Assessment (Signed)
Comprehensive Clinical Assessment (CCA) Screening, Triage and Referral Note  01/14/2020 Sydney [name] [id number]  Visit Diagnosis:  F11.23, Opioid withdrawal   ICD-10-CM   1. Intentional drug overdose, initial encounter (HCC)  T50.902A   2. Suicidal ideation  R45.851    Sydney [name] is a 59 year old patient who was involuntarily brought to [facility] by an ambulance after her mother/mother's friend called 911 due to pt taking 10 Baclofen pills. Pt states, "I hurt, and that's all; I thought it would stop it. I came off of methadone and it's killing me. It can't stand it. It's all because I don't have any damn money. It's been a month and my legs still hurt--it doesn't make any sense. I don't want to come off of it--I just don't have any freaking way to get it. I got sick so they [my family] called [an ambulance]; God forbid they should pay the $14 to buy the methadone. I just don't want to be in pain anymore." Pt shared she also doesn't want to be "here;" when clinician asked for specifics in regards to what she means by "here," pt verified she means she no longer wants to be on [place] having to be in pain.  Pt endorses SI. She denies HI, AVH, NSSIB, access to guns/weapons (though she states, "anybody can get access to a gun or a weapon nowadays"), or engagement with the legal system. She states she has smoked marijuana daily since she was 80. Clinician did note that pt made several comments during the assessment that appeared as if pt was talking to someone ("no, I'm not crazy"), though this could have been pt reassuring herself.  Pt's protective factors include stable housing, lack of HI, and lack of NSSIB.  Pt provided clinician verbal consent to contact her mother for collateral information, though pt was unable to provide clinician with her mother's phone number.  Pt is oriented x4; she could not remember the year, even when she attempted several times. Pt's mental recollection was UTA. Pt was  irritable and, at times, hostile throughout the assessment. Pt's insight, judgement, and impulse control is poor at this time.   Patient Reported Information How did you hear about [place]? Family/Friend   Referral name: Unknown - pt's boyfriend called 911   Referral phone number: No data recorded Whom do you see for routine medical problems? Other (Comment) (UTA - pt is unable to answer all questions)   Practice/Facility Name: No data recorded  Practice/Facility Phone Number: No data recorded  Name of Contact: No data recorded  Contact Number: No data recorded  Contact Fax Number: No data recorded  Prescriber Name: No data recorded  Prescriber Address (if known): No data recorded What Is the Reason for Your Visit/Call Today? Pt is going through methadone w/d  How Long Has This Been Causing You Problems? 1 wk - 1 month  Have You Recently Been in Any Inpatient Treatment (Hospital/Detox/Crisis Center/28-Day Program)? Yes   Name/Location of Program/Hospital:[facility]   How Long Were You There? 21 days   When Were You Discharged? No data recorded Have You Ever Received Services From [facility] Before? No   Who Do You See at [facility]? No data recorded Have You Recently Had Any Thoughts About Hurting Yourself? Yes   Are You Planning to Commit Suicide/Harm Yourself At This time?  No  Have you Recently Had Thoughts About Hurting Someone Karolee Ohs? No   Explanation: No data recorded Have You Used Any Alcohol or Drugs in  the Past 24 Hours? Yes   How Long Ago Did You Use Drugs or Alcohol?  No data recorded  What Did You Use and How Much? Pt states she has been using marijuana  What Do You Feel Would Help You the Most Today? Medication  Do You Currently Have a Therapist/Psychiatrist? No   Name of Therapist/Psychiatrist: No data recorded  Have You Been Recently Discharged From Any Office Practice or Programs? Yes   Explanation of Discharge From Practice/Program:  Pt was  d/c from the [facility] due to not having the funds to purchase the methadone; was previously d/c from the [facility] for using marijuana     CCA Screening Triage Referral Assessment Type of Contact: Tele-Assessment   Is this Initial or Reassessment? Initial Assessment   Date Telepsych consult ordered in [facility]:  01/14/20   Time Telepsych consult ordered in [facility]:  No data recorded Patient Reported Information Reviewed? No data recorded  Patient Left Without Being Seen? No data recorded  Reason for Not Completing Assessment: No data recorded Collateral Involvement: No data recorded Does Patient Have a Court Appointed Legal Guardian? No data recorded  Name and Contact of Legal Guardian:  No data recorded If Minor and Not Living with Parent(s), Who has Custody? No data recorded Is CPS involved or ever been involved? No data recorded Is APS involved or ever been involved? No data recorded Patient Determined To Be At Risk for Harm To Self or Others Based on Review of Patient Reported Information or Presenting Complaint? No data recorded  Method: No data recorded  Availability of Means: No data recorded  Intent: No data recorded  Notification Required: No data recorded  Additional Information for Danger to Others Potential:  No data recorded  Additional Comments for Danger to Others Potential:  No data recorded  Are There Guns or Other Weapons in Your Home?  No data recorded   Types of Guns/Weapons: No data recorded   Are These Weapons Safely Secured?                              No data recorded   Who Could Verify You Are Able To Have These Secured:    No data recorded Do You Have any Outstanding Charges, Pending Court Dates, Parole/Probation? No data recorded Contacted To Inform of Risk of Harm To Self or Others: No data recorded Location of Assessment: No data recorded Does Patient Present under Involuntary Commitment? No data recorded  IVC Papers Initial File  Date: No data recorded  [place] of Residence: [place]  Patient Currently Receiving the Following Services: No data recorded  Determination of Need: No data recorded  Options For Referral: No data recorded  Nira Conn, NP, reviewed pt's chart and information and determined pt meets inpatient criteria. There are currently no appropriate beds at [facility], so pt's referral information will be faxed out to multiple hospitals for potential placement. This information was provided to pt's nurse, Moshe Salisbury, at [phone].  Ralph Dowdy, LMFT

## 2020-01-14 NOTE — Progress Notes (Signed)
Pt accepted to [facility], bed 300-1    Dr. Lucianne Muss is the accepting provider.    Dr. Jama Flavors is the attending provider.    Call report to [phone]    Pt is scheduled to arrive at [facility] at 3pm but must have tested negative for Covid prior to transport.    Wells Guiles, LCSW, LCAS Disposition CSW [facility] [phone]

## 2020-01-14 NOTE — Progress Notes (Signed)
   01/14/20 2319  Psych Admission Type (Psych Patients Only)  Admission Status Voluntary  Psychosocial Assessment  Patient Complaints Other (Comment) (pt sleeping)  Eye Contact Brief  Facial Expression Flat  Affect Depressed  Speech Logical/coherent  Interaction Assertive  Motor Activity Other (Comment) (WDL)  Appearance/Hygiene In scrubs  Behavior Characteristics Other (Comment)  Mood Other (Comment) (sleeping)  Thought Process  Coherency WDL  Content WDL  Delusions None reported or observed  Perception WDL  Hallucination None reported or observed  Judgment WDL  Confusion None  Danger to Self  Current suicidal ideation? Denies  Danger to Others  Danger to Others None reported or observed  pt sleeping since begining of shift. Will continue to monitor for safety and stability.

## 2020-01-14 NOTE — Tx Team (Signed)
Initial Treatment Plan 01/14/2020 6:31 PM [name] LDJ:[id number]    PATIENT STRESSORS: Substance abuse   PATIENT STRENGTHS: Average or above average intelligence Communication skills General fund of knowledge Motivation for treatment/growth Physical Health   PATIENT IDENTIFIED PROBLEMS: anxiety  depression  Substance abuse "I just don't want to be in any more pain"                 DISCHARGE CRITERIA:  Motivation to continue treatment in a less acute level of care Verbal commitment to aftercare and medication compliance  PRELIMINARY DISCHARGE PLAN: Outpatient therapy Placement in alternative living arrangements  PATIENT/FAMILY INVOLVEMENT: This treatment plan has been presented to and reviewed with the patient, Sydney [name], and/or family member.  The patient and family have been given the opportunity to ask questions and make suggestions.  Hoover Browns, RN 01/14/2020, 6:31 PM

## 2020-01-15 LAB — RAPID URINE DRUG SCREEN, HOSP PERFORMED
Amphetamines: NOT DETECTED
Barbiturates: NOT DETECTED
Benzodiazepines: POSITIVE — AB
Cocaine: NOT DETECTED
Opiates: NOT DETECTED
Tetrahydrocannabinol: POSITIVE — AB

## 2020-01-15 LAB — TSH: TSH: 3.926 u[IU]/mL (ref 0.350–4.500)

## 2020-01-15 NOTE — Progress Notes (Signed)
°   01/15/20 0748  Vital Signs  Temp 98.2 F (36.8 C)  Temp Source Oral  Pulse Rate 90  BP 105/84  BP Location Right Arm  BP Method Automatic  Patient Position (if appropriate) Standing  Oxygen Therapy  SpO2 100 %   D: Patient denies SI/HI/AVH. Patient reported anxiety 10/10.1 mg of Ativan was given. Patient had a BP of 72/51, pulse of 67 lying down on right arm. Patient reported feeling "dizzy"  Patient was given Gatorade. Patient's vitals on recheck on left arm: sitting, 94/70 pulse of 77: Standing left arm,98/72, pulse 87. Patient was given some more Gatorade. Patient reported that her blood pressure runs low. A:  Patient took scheduled medicine except for the Catapres in the afternoon d/t low blood pressure.  Support and encouragement provided Routine safety checks conducted every 15 minutes. Patient  Informed to notify staff with any concerns.  R: Safety maintained.

## 2020-01-15 NOTE — Progress Notes (Signed)
Pt. Complained of a headache  Pain 6/10 650 mg of Tylenol given. Pt.  stated that she is missing her shoes and  readers from [facility], and that may bre contributing to her headache. Readers were 2.5. Complained of stomach cramps 20 mg of bentyl given.

## 2020-01-15 NOTE — Progress Notes (Deleted)
   01/15/20 0748  Vital Signs  Temp 98.2 F (36.8 C)  Temp Source Oral  Pulse Rate 90  BP 105/84  BP Location Right Arm  BP Method Automatic  Patient Position (if appropriate) Standing  Oxygen Therapy  SpO2 100 %   D: Patient presents with an anxious affect. Patient denies SI/HI / AVH. Patient rated anxiety 10/10, 1 mg of Ativan was given A:  Patient took scheduled medicine.  Support and encouragement provided Routine safety checks conducted every 15 minutes. Patient  Informed to notify staff with any concerns.   R: Safety maintained.

## 2020-01-15 NOTE — Progress Notes (Signed)
   01/15/20 2140  COVID-19 Daily Checkoff  Have you had a fever (temp > 37.80C/100F)  in the past 24 hours?  No  COVID-19 EXPOSURE  Have you traveled outside the state in the past 14 days? No  Have you been in contact with someone with a confirmed diagnosis of COVID-19 or PUI in the past 14 days without wearing appropriate PPE? No  Have you been living in the same home as a person with confirmed diagnosis of COVID-19 or a PUI (household contact)? No  Have you been diagnosed with COVID-19? No

## 2020-01-15 NOTE — Progress Notes (Signed)
[facility] MD Progress Note  01/15/2020 2:14 PM Sydney [name]  MRN:  [id number] Subjective: Patient is a 59 year old female with a past psychiatric history significant for opiate dependence, benzodiazepine dependence, chronic pain who was brought to the [facility] emergency department on 01/13/2020 after an intentional overdose of 10 baclofen tablets.  Objective: Patient is seen and examined.  Patient is a 59 year old female with the above-stated past psychiatric history who is seen in follow-up.  She states she does not feel well today.  We discussed more about her plans to restart her methadone as an outpatient.  Today she disclosed that she does not have an appointment with the clinic, and if her mother is unwilling to transport her she will not get the methadone.  She denies suicidal ideation today.  Her vital signs are stable if not just slightly low.  She is afebrile.  She slept 6.75 hours last night.  Her drug screen results are still pending, so we are still unaware of all the substances that may be present.  Her TSH came back at 3.926.  No history of hypothyroidism and that is found in the chart.  Principal Problem: <principal problem not specified> Diagnosis: Active Problems:   Intentional overdose of drug in tablet form (HCC)  Total Time spent with patient: 20 minutes  Past Psychiatric History: See admission H&P  Past Medical History:  Past Medical History:  Diagnosis Date  . Anxiety   . Back pain   . C. difficile colitis   . Chronic back pain   . Depression   . Hepatitis C   . Opiate dependence (HCC)    detox 06/2012  . Sciatic pain     Past Surgical History:  Procedure Laterality Date  . ABDOMINAL HYSTERECTOMY    . APPENDECTOMY    . CHOLECYSTECTOMY    . LEFT OOPHORECTOMY     Family History:  Family History  Adopted: Yes   Family Psychiatric  History: See admission H&P Social History:  Social History   Substance and Sexual Activity  Alcohol Use No     Social History    Substance and Sexual Activity  Drug Use Yes  . Types: Marijuana   Comment: THC 3/day; unknown amount    Social History   Socioeconomic History  . Marital status: Single    Spouse name: Not on file  . Number of children: Not on file  . Years of education: Not on file  . Highest education level: Not on file  Occupational History  . Not on file  Tobacco Use  . Smoking status: Current Every Day Smoker    Packs/day: 1.00    Types: Cigarettes  . Smokeless tobacco: Never Used  Substance and Sexual Activity  . Alcohol use: No  . Drug use: Yes    Types: Marijuana    Comment: THC 3/day; unknown amount  . Sexual activity: Not Currently    Birth control/protection: Surgical  Other Topics Concern  . Not on file  Social History Narrative  . Not on file   Social Determinants of Health   Financial Resource Strain:   . Difficulty of Paying Living Expenses:   Food Insecurity:   . Worried About Programme researcher, broadcasting/film/video in the Last Year:   . Barista in the Last Year:   Transportation Needs:   . Freight forwarder (Medical):   Marland Kitchen Lack of Transportation (Non-Medical):   Physical Activity:   . Days of Exercise per Week:   .  Minutes of Exercise per Session:   Stress:   . Feeling of Stress :   Social Connections:   . Frequency of Communication with Friends and Family:   . Frequency of Social Gatherings with Friends and Family:   . Attends Religious Services:   . Active Member of Clubs or Organizations:   . Attends Banker Meetings:   Marland Kitchen Marital Status:    Additional Social History:                         Sleep: Good  Appetite:  Fair  Current Medications: Current Facility-Administered Medications  Medication Dose Route Frequency Provider Last Rate Last Admin  . acetaminophen (TYLENOL) tablet 650 mg  650 mg Oral Q6H PRN Antonieta Pert, MD      . alum & mag hydroxide-simeth (MAALOX/MYLANTA) 200-200-20 MG/5ML suspension 30 mL  30 mL Oral Q4H  PRN Antonieta Pert, MD      . cloNIDine (CATAPRES) tablet 0.1 mg  0.1 mg Oral QID Antonieta Pert, MD   0.1 mg at 01/15/20 0750   Followed by  . [START ON 01/17/2020] cloNIDine (CATAPRES) tablet 0.1 mg  0.1 mg Oral BH-qamhs Kandace Elrod, Marlane Mingle, MD       Followed by  . [START ON 01/19/2020] cloNIDine (CATAPRES) tablet 0.1 mg  0.1 mg Oral QAC breakfast Antonieta Pert, MD      . dicyclomine (BENTYL) tablet 20 mg  20 mg Oral Q6H PRN Antonieta Pert, MD      . DULoxetine (CYMBALTA) DR capsule 30 mg  30 mg Oral Daily Antonieta Pert, MD   30 mg at 01/15/20 0747  . hydrOXYzine (ATARAX/VISTARIL) tablet 25 mg  25 mg Oral TID PRN Antonieta Pert, MD   25 mg at 01/14/20 2356  . loperamide (IMODIUM) capsule 2-4 mg  2-4 mg Oral PRN Antonieta Pert, MD      . LORazepam (ATIVAN) tablet 1 mg  1 mg Oral Q6H PRN Antonieta Pert, MD   1 mg at 01/15/20 0753  . magnesium hydroxide (MILK OF MAGNESIA) suspension 30 mL  30 mL Oral Daily PRN Antonieta Pert, MD      . methocarbamol (ROBAXIN) tablet 500 mg  500 mg Oral Q8H PRN Antonieta Pert, MD   500 mg at 01/14/20 1840  . naproxen (NAPROSYN) tablet 500 mg  500 mg Oral BID PRN Antonieta Pert, MD      . ondansetron (ZOFRAN-ODT) disintegrating tablet 4 mg  4 mg Oral Q6H PRN Antonieta Pert, MD   4 mg at 01/14/20 1841  . pantoprazole (PROTONIX) EC tablet 40 mg  40 mg Oral Daily Antonieta Pert, MD   40 mg at 01/15/20 0747  . traZODone (DESYREL) tablet 50 mg  50 mg Oral QHS PRN Antonieta Pert, MD   50 mg at 01/14/20 2356    Lab Results:  Results for orders placed or performed during the hospital encounter of 01/14/20 (from the past 48 hour(s))  TSH     Status: None   Collection Time: 01/15/20  6:40 AM  Result Value Ref Range   TSH 3.926 0.350 - 4.500 uIU/mL    Comment: Performed by a 3rd Generation assay with a functional sensitivity of <=0.01 uIU/mL. Performed at [facility], [address]  [place], [place] [address]     Blood Alcohol level:  Lab Results  Component Value Date  ETH <10 01/13/2020   ETH <5 12/16/2016    Metabolic Disorder Labs: No results found for: HGBA1C, MPG No results found for: PROLACTIN No results found for: CHOL, TRIG, HDL, CHOLHDL, VLDL, LDLCALC  Physical Findings: AIMS:  , ,  ,  ,    CIWA:  CIWA-Ar Total: 10 COWS:  COWS Total Score: 10  Musculoskeletal: Strength & Muscle Tone: within normal limits Gait & Station: normal Patient leans: N/A  Psychiatric Specialty Exam: Physical Exam Vitals and nursing note reviewed.  HENT:     Head: Normocephalic and atraumatic.  Pulmonary:     Effort: Pulmonary effort is normal.  Neurological:     General: No focal deficit present.     Mental Status: She is oriented to person, place, and time.     Review of Systems  Blood pressure 98/72, pulse 87, temperature 98.2 F (36.8 C), temperature source Oral, resp. rate 16, height 5\' 5"  (1.651 m), weight 63.5 kg, SpO2 100 %.Body mass index is 23.3 kg/m.  General Appearance: Disheveled  Eye Contact:  Fair  Speech:  Normal Rate  Volume:  Decreased  Mood:  Anxious  Affect:  Congruent  Thought Process:  Coherent and Descriptions of Associations: Circumstantial  Orientation:  Full (Time, Place, and Person)  Thought Content:  Logical  Suicidal Thoughts:  No  Homicidal Thoughts:  No  Memory:  Immediate;   Fair Recent;   Fair Remote;   Fair  Judgement:  Intact  Insight:  Lacking  Psychomotor Activity:  Normal  Concentration:  Concentration: Fair and Attention Span: Fair  Recall:  of Knowledge:  Fair  Language:  Fair  Akathisia:  Negative  Handed:  Right  AIMS (if indicated):     Assets:  Desire for Improvement Resilience Social Support  ADL's:  Intact  Cognition:  WNL  Sleep:  Number of Hours: 6.75     Treatment Plan Summary: Daily contact with patient to assess and evaluate symptoms and progress in treatment, Medication management  and Plan : Patient is seen and examined.  Patient is a 59 year old female with the above-stated past psychiatric history who is seen in follow-up.   Diagnosis: 1.  Opiate dependence 2.  Benzodiazepine dependence 3.  Intentional overdose of baclofen 4.  Unspecified depression  Pertinent findings on examination today: 1.  Still dysphoric and complaining of chronic pain. 2.  Vital signs remain stable without evidence of withdrawal from opiates or benzodiazepines.  Plan: 1.  Continue opiate detox protocol but stop Catapres components given low blood pressure. 2.  Continue duloxetine 30 mg p.o. daily for depression and anxiety. 3.  Continue Protonix 40 mg p.o. daily for gastric protection. 4.  Awaiting results of urine drug screen. 5.  Continue seizure precautions. 6.  Continue trazodone 50 mg p.o. nightly as needed insomnia. 7.  Disposition planning-in progress.  59, MD 01/15/2020, 2:14 PM

## 2020-01-15 NOTE — Tx Team (Signed)
Interdisciplinary Treatment and Diagnostic Plan Update  01/15/2020 Time of Session: 9:05am MADILINE [name] MRN: [id number]  Principal Diagnosis: <principal problem not specified>  Secondary Diagnoses: Active Problems:   Intentional overdose of drug in tablet form ([place])   Current Medications:  Current Facility-Administered Medications  Medication Dose Route Frequency Provider Last Rate Last Admin  . acetaminophen (TYLENOL) tablet 650 mg  650 mg Oral Q6H PRN Sharma Covert, MD      . alum & mag hydroxide-simeth (MAALOX/MYLANTA) 200-200-20 MG/5ML suspension 30 mL  30 mL Oral Q4H PRN Sharma Covert, MD      . dicyclomine (BENTYL) tablet 20 mg  20 mg Oral Q6H PRN Sharma Covert, MD      . DULoxetine (CYMBALTA) DR capsule 30 mg  30 mg Oral Daily Sharma Covert, MD   30 mg at 01/15/20 0747  . hydrOXYzine (ATARAX/VISTARIL) tablet 25 mg  25 mg Oral TID PRN Sharma Covert, MD   25 mg at 01/14/20 2356  . loperamide (IMODIUM) capsule 2-4 mg  2-4 mg Oral PRN Sharma Covert, MD      . LORazepam (ATIVAN) tablet 1 mg  1 mg Oral Q6H PRN Sharma Covert, MD   1 mg at 01/15/20 0753  . magnesium hydroxide (MILK OF MAGNESIA) suspension 30 mL  30 mL Oral Daily PRN Sharma Covert, MD      . methocarbamol (ROBAXIN) tablet 500 mg  500 mg Oral Q8H PRN Sharma Covert, MD   500 mg at 01/14/20 1840  . naproxen (NAPROSYN) tablet 500 mg  500 mg Oral BID PRN Sharma Covert, MD      . ondansetron (ZOFRAN-ODT) disintegrating tablet 4 mg  4 mg Oral Q6H PRN Sharma Covert, MD   4 mg at 01/14/20 1841  . pantoprazole (PROTONIX) EC tablet 40 mg  40 mg Oral Daily Sharma Covert, MD   40 mg at 01/15/20 0747  . traZODone (DESYREL) tablet 50 mg  50 mg Oral QHS PRN Sharma Covert, MD   50 mg at 01/14/20 2356   PTA Medications: Medications Prior to Admission  Medication Sig Dispense Refill Last Dose  . cloNIDine (CATAPRES) 0.1 MG tablet Take 1 tablet (0.1 mg total) by mouth 2 (two)  times daily. (Patient not taking: Reported on 01/14/2020) 10 tablet 0   . dicyclomine (BENTYL) 20 MG tablet Take 1 tablet (20 mg total) by mouth every 6 (six) hours as needed for spasms. (Patient not taking: Reported on 01/14/2020) 12 tablet 0   . ibuprofen (ADVIL) 600 MG tablet Take 1 tablet (600 mg total) by mouth every 6 (six) hours as needed. (Patient not taking: Reported on 01/14/2020) 20 tablet 0   . loperamide (IMODIUM) 2 MG capsule Take 1 capsule (2 mg total) by mouth 4 (four) times daily as needed for diarrhea or loose stools. (Patient not taking: Reported on 01/14/2020) 12 capsule 0   . methadone (DOLOPHINE) 10 MG/ML solution Take 65 mg by mouth daily.      . naproxen sodium (ALEVE) 220 MG tablet Take 220 mg by mouth daily as needed (pain).     . ondansetron (ZOFRAN) 4 MG tablet Take 1 tablet (4 mg total) by mouth every 6 (six) hours. (Patient not taking: Reported on 01/14/2020) 12 tablet 0     Patient Stressors: Substance abuse  Patient Strengths: Average or above average intelligence Communication skills General fund of knowledge Motivation for treatment/growth Physical Health  Treatment Modalities: Medication  Management, Group therapy, Case management,  1 to 1 session with clinician, Psychoeducation, Recreational therapy.   Physician Treatment Plan for Primary Diagnosis: <principal problem not specified> Long Term Goal(s): Improvement in symptoms so as ready for discharge Improvement in symptoms so as ready for discharge   Short Term Goals: Ability to identify changes in lifestyle to reduce recurrence of condition will improve Ability to verbalize feelings will improve Ability to disclose and discuss suicidal ideas Ability to demonstrate self-control will improve Ability to identify and develop effective coping behaviors will improve Ability to maintain clinical measurements within normal limits will improve Ability to identify triggers associated with substance abuse/mental  health issues will improve Ability to identify changes in lifestyle to reduce recurrence of condition will improve Ability to verbalize feelings will improve Ability to disclose and discuss suicidal ideas Ability to demonstrate self-control will improve Ability to identify and develop effective coping behaviors will improve Ability to maintain clinical measurements within normal limits will improve Ability to identify triggers associated with substance abuse/mental health issues will improve  Medication Management: Evaluate patient's response, side effects, and tolerance of medication regimen.  Therapeutic Interventions: 1 to 1 sessions, Unit Group sessions and Medication administration.  Evaluation of Outcomes: Not Met  Physician Treatment Plan for Secondary Diagnosis: Active Problems:   Intentional overdose of drug in tablet form ([place])  Long Term Goal(s): Improvement in symptoms so as ready for discharge Improvement in symptoms so as ready for discharge   Short Term Goals: Ability to identify changes in lifestyle to reduce recurrence of condition will improve Ability to verbalize feelings will improve Ability to disclose and discuss suicidal ideas Ability to demonstrate self-control will improve Ability to identify and develop effective coping behaviors will improve Ability to maintain clinical measurements within normal limits will improve Ability to identify triggers associated with substance abuse/mental health issues will improve Ability to identify changes in lifestyle to reduce recurrence of condition will improve Ability to verbalize feelings will improve Ability to disclose and discuss suicidal ideas Ability to demonstrate self-control will improve Ability to identify and develop effective coping behaviors will improve Ability to maintain clinical measurements within normal limits will improve Ability to identify triggers associated with substance abuse/mental health  issues will improve     Medication Management: Evaluate patient's response, side effects, and tolerance of medication regimen.  Therapeutic Interventions: 1 to 1 sessions, Unit Group sessions and Medication administration.  Evaluation of Outcomes: Not Met   RN Treatment Plan for Primary Diagnosis: <principal problem not specified> Long Term Goal(s): Knowledge of disease and therapeutic regimen to maintain health will improve  Short Term Goals: Ability to remain free from injury will improve, Ability to disclose and discuss suicidal ideas, Ability to identify and develop effective coping behaviors will improve and Compliance with prescribed medications will improve  Medication Management: RN will administer medications as ordered by provider, will assess and evaluate patient's response and provide education to patient for prescribed medication. RN will report any adverse and/or side effects to prescribing provider.  Therapeutic Interventions: 1 on 1 counseling sessions, Psychoeducation, Medication administration, Evaluate responses to treatment, Monitor vital signs and CBGs as ordered, Perform/monitor CIWA, COWS, AIMS and Fall Risk screenings as ordered, Perform wound care treatments as ordered.  Evaluation of Outcomes: Not Met   LCSW Treatment Plan for Primary Diagnosis: <principal problem not specified> Long Term Goal(s): Safe transition to appropriate next level of care at discharge, Engage patient in therapeutic group addressing interpersonal concerns.  Short Term Goals: Engage  patient in aftercare planning with referrals and resources, Facilitate patient progression through stages of change regarding substance use diagnoses and concerns, Identify triggers associated with mental health/substance abuse issues and Increase skills for wellness and recovery  Therapeutic Interventions: Assess for all discharge needs, 1 to 1 time with Social worker, Explore available resources and support  systems, Assess for adequacy in community support network, Educate family and significant other(s) on suicide prevention, Complete Psychosocial Assessment, Interpersonal group therapy.  Evaluation of Outcomes: Not Met   Progress in Treatment: Attending groups: No. Participating in groups: No. Taking medication as prescribed: Yes. Toleration medication: Yes. Family/Significant other contact made: No, will contact:  if consent is provided. Patient understands diagnosis: Yes. Discussing patient identified problems/goals with staff: Yes. Medical problems stabilized or resolved: No. Denies suicidal/homicidal ideation: Yes. Issues/concerns per patient self-inventory: No.  New problem(s) identified: No, Describe:  CSW will continue to assess.  New Short Term/Long Term Goal(s): medication stabilization, elimination of SI thoughts, development of comprehensive mental wellness plan.   Patient Goals:  "To get up and get out; do what I have to do"  Discharge Plan or Barriers: Patient recently admitted. CSW will continue to follow and assess for appropriate referrals and possible discharge planning.   Reason for Continuation of Hospitalization: Depression Medication stabilization Withdrawal symptoms  Estimated Length of Stay: 3-5 days  Attendees: Patient: Sydney [name] 01/15/2020   Physician: Myles Lipps, MD 01/15/2020   Nursing:  01/15/2020   RN Care Manager: 01/15/2020   Social Worker: Darletta Moll, [place] 01/15/2020   Recreational Therapist:  01/15/2020   Other:  01/15/2020   Other:  01/15/2020   Other: 01/15/2020       Scribe for Treatment Team: Vassie Moselle, [place] 01/15/2020 2:48 PM

## 2020-01-15 NOTE — Progress Notes (Signed)
   01/15/20 2140  Psych Admission Type (Psych Patients Only)  Admission Status Voluntary  Psychosocial Assessment  Patient Complaints Anxiety;Depression;Insomnia  Eye Contact Avoids  Facial Expression Flat;Pained  Affect Depressed;Anxious;Angry;Irritable  Speech Soft;Slow;Logical/coherent  Interaction Minimal;Isolative  Motor Activity Unsteady (hypotensive and c/o light-headedness)  Appearance/Hygiene Unremarkable  Behavior Characteristics Anxious;Irritable  Mood Depressed;Anxious;Irritable  Thought Process  Coherency WDL  Content WDL  Delusions None reported or observed  Perception WDL  Hallucination None reported or observed  Judgment WDL  Confusion None  Danger to Self  Current suicidal ideation? Denies  Danger to Others  Danger to Others None reported or observed

## 2020-01-16 MED ORDER — DULOXETINE HCL 60 MG PO CPEP
60.0000 mg | ORAL_CAPSULE | Freq: Every day | ORAL | Status: DC
  Administered 2020-01-17 – 2020-01-20 (×4): 60 mg via ORAL
  Filled 2020-01-16: qty 1
  Filled 2020-01-16: qty 7
  Filled 2020-01-16 (×4): qty 1

## 2020-01-16 MED ORDER — MAGNESIUM HYDROXIDE 400 MG/5ML PO SUSP: 15.0000 mL | Freq: Every day | ORAL | Status: DC | PRN

## 2020-01-16 MED ORDER — PREGABALIN 25 MG PO CAPS
25.0000 mg | ORAL_CAPSULE | Freq: Two times a day (BID) | ORAL | Status: DC
  Administered 2020-01-16 – 2020-01-18 (×4): 25 mg via ORAL
  Filled 2020-01-16 (×4): qty 1

## 2020-01-16 MED ORDER — FLEET ENEMA 7-19 GM/118ML RE ENEM
1.0000 | ENEMA | Freq: Every day | RECTAL | Status: DC | PRN
  Filled 2020-01-16: qty 1

## 2020-01-16 NOTE — Progress Notes (Signed)
Pt's vital signs have slightly improved this AM. BP 94/65 sitting and pulse 67 at 0606. BP 92/61 standing and pulse 84. PO fluids continue to be encouraged. Pt continues to c/o of feeling light-headed and is on high fall risk protocol. Pt has yellow non-skid socks on and a fall risk bracelet. Pt educated to alert staff when she needs assistance to the bathroom, to change positions slowly, and to dangle on the edge of the bed for a few minutes before getting up. Pt's bilateral pain in her legs has decreased to a 2 this AM on a scale of 0-10 (10 being the worst).

## 2020-01-16 NOTE — Progress Notes (Signed)
[facility] Progress Note  01/16/2020 3:21 PM Sydney RobinsonsJewel A [name]  MRN:  [id number] Subjective:  Patient states her mood is " Better " today and she is mad at herself that "how things got out the hands". She states she is frustrated as she can not see anything without her glasses and she has difficulty eating without her dentures. She denies any suicidal ideation today. No HI or AVH. She complains of stomach ache and reports has not pooped in last 4 days. She reports feeling light headedness. She states she is not sure about Methadone yet, taking her time to make that decision for her.   Objective: Patient is seen and examined. Patient is a 59 year old female with a past psychiatric history significant for opiate dependence, benzodiazepine dependence, chronic pain who was [facility] to [facility] emergency department on 01/13/2020 after an intentional overdose of 10 baclofen tablets. She is alert, cooperative and oriented. She has not gone out of her room as its difficult for her to navigate without glasses. She is happy about going home tomorrow. Her blood is low 92/61, other vitals normal. No new labs today.   Principal Problem: <principal problem not specified> Diagnosis: Active Problems:   Intentional overdose of drug in tablet form ([facility])  Total Time spent with patient: 20 minutes  Past Psychiatric History: See H & P  Past Medical History:  Past Medical History:  Diagnosis Date  . Anxiety   . Back pain   . C. difficile colitis   . Chronic back pain   . Depression   . Hepatitis C   . Opiate dependence ([facility])    detox 06/2012  . Sciatic pain     Past Surgical History:  Procedure Laterality Date  . ABDOMINAL HYSTERECTOMY    . APPENDECTOMY    . CHOLECYSTECTOMY    . LEFT OOPHORECTOMY     Family History:  Family History  Adopted: Yes   Family Psychiatric  History: See H & P Social History:  Social History   Substance and Sexual Activity  Alcohol Use No     Social History    Substance and Sexual Activity  Drug Use Yes  . Types: Marijuana   Comment: THC 3/day; unknown amount    Social History   Socioeconomic History  . Marital status: Single    Spouse name: Not on file  . Number of children: Not on file  . Years of education: Not on file  . Highest education level: Not on file  Occupational History  . Not on file  Tobacco Use  . Smoking status: Current Every Day Smoker    Packs/day: 1.00    Types: Cigarettes  . Smokeless tobacco: Never Used  Substance and Sexual Activity  . Alcohol use: No  . Drug use: Yes    Types: Marijuana    Comment: THC 3/day; unknown amount  . Sexual activity: Not Currently    Birth control/protection: Surgical  Other Topics Concern  . Not on file  Social History Narrative  . Not on file   Social Determinants of Health   Financial Resource Strain:   . Difficulty of Paying Living Expenses:   Food Insecurity:   . Worried About Programme researcher, broadcasting/film/videounning Out of Food in the Last Year:   . Baristaan Out of Food in the Last Year:   Transportation Needs:   . Freight forwarderLack of Transportation (Medical):   Marland Kitchen. Lack of Transportation (Non-Medical):   Physical Activity:   . Days of Exercise per Week:   .  Minutes of Exercise per Session:   Stress:   . Feeling of Stress :   Social Connections:   . Frequency of Communication with Friends and Family:   . Frequency of Social Gatherings with Friends and Family:   . Attends Religious Services:   . Active Member of Clubs or Organizations:   . Attends Banker Meetings:   Marland Kitchen Marital Status:    Additional Social History:                         Sleep: Poor  Appetite:  Good  Current Medications: Current Facility-Administered Medications  Medication Dose Route Frequency Provider Last Rate Last Admin  . acetaminophen (TYLENOL) tablet 650 mg  650 mg Oral Q6H PRN Antonieta Pert, MD   650 mg at 01/16/20 1610  . alum & mag hydroxide-simeth (MAALOX/MYLANTA) 200-200-20 MG/5ML suspension  30 mL  30 mL Oral Q4H PRN Antonieta Pert, MD      . dicyclomine (BENTYL) tablet 20 mg  20 mg Oral Q6H PRN Antonieta Pert, MD   20 mg at 01/15/20 1659  . [START ON 01/17/2020] DULoxetine (CYMBALTA) DR capsule 60 mg  60 mg Oral Daily Antonieta Pert, MD      . hydrOXYzine (ATARAX/VISTARIL) tablet 25 mg  25 mg Oral TID PRN Antonieta Pert, MD   25 mg at 01/16/20 0747  . loperamide (IMODIUM) capsule 2-4 mg  2-4 mg Oral PRN Antonieta Pert, MD      . magnesium hydroxide (MILK OF MAGNESIA) suspension 15 mL  15 mL Oral Daily PRN Antonieta Pert, MD      . magnesium hydroxide (MILK OF MAGNESIA) suspension 30 mL  30 mL Oral Daily PRN Antonieta Pert, MD      . methocarbamol (ROBAXIN) tablet 500 mg  500 mg Oral Q8H PRN Antonieta Pert, MD   500 mg at 01/15/20 2141  . naproxen (NAPROSYN) tablet 500 mg  500 mg Oral BID PRN Antonieta Pert, MD      . ondansetron (ZOFRAN-ODT) disintegrating tablet 4 mg  4 mg Oral Q6H PRN Antonieta Pert, MD   4 mg at 01/15/20 2006  . pantoprazole (PROTONIX) EC tablet 40 mg  40 mg Oral Daily Antonieta Pert, MD   40 mg at 01/16/20 0745  . pregabalin (LYRICA) capsule 25 mg  25 mg Oral BID Antonieta Pert, MD   25 mg at 01/16/20 1341  . sodium phosphate (FLEET) 7-19 GM/118ML enema 1 enema  1 enema Rectal Daily PRN Antonieta Pert, MD      . traZODone (DESYREL) tablet 50 mg  50 mg Oral QHS PRN Antonieta Pert, MD   50 mg at 01/14/20 2356    Lab Results:  Results for orders placed or performed during the hospital encounter of 01/14/20 (from the past 48 hour(s))  TSH     Status: None   Collection Time: 01/15/20  6:40 AM  Result Value Ref Range   TSH 3.926 0.350 - 4.500 uIU/mL    Comment: Performed by a 3rd Generation assay with a functional sensitivity of <=0.01 uIU/mL. Performed at [facility], [address], [place], [place] [address]   Rapid urine drug screen (hospital performed)     Status: Abnormal    Collection Time: 01/15/20  7:39 AM  Result Value Ref Range   Opiates NONE DETECTED NONE DETECTED   Cocaine NONE DETECTED NONE DETECTED  Benzodiazepines POSITIVE (A) NONE DETECTED   Amphetamines NONE DETECTED NONE DETECTED   Tetrahydrocannabinol POSITIVE (A) NONE DETECTED   Barbiturates NONE DETECTED NONE DETECTED    Comment: (NOTE) DRUG SCREEN FOR MEDICAL PURPOSES ONLY.  IF CONFIRMATION IS NEEDED FOR ANY PURPOSE, NOTIFY LAB WITHIN 5 DAYS.  LOWEST DETECTABLE LIMITS FOR URINE DRUG SCREEN Drug Class                     Cutoff (ng/mL) Amphetamine and metabolites    1000 Barbiturate and metabolites    200 Benzodiazepine                 200 Tricyclics and metabolites     300 Opiates and metabolites        300 Cocaine and metabolites        300 THC                            50 Performed at [facility], [address], [place], [place] [address]     Blood Alcohol level:  Lab Results  Component Value Date   ETH <10 01/13/2020   ETH <5 12/16/2016    Metabolic Disorder Labs: No results found for: HGBA1C, MPG No results found for: PROLACTIN No results found for: CHOL, TRIG, HDL, CHOLHDL, VLDL, LDLCALC  Physical Findings: AIMS:  , ,  ,  ,    CIWA:  CIWA-Ar Total: 10 COWS:  COWS Total Score: 5  Musculoskeletal: Strength & Muscle Tone: abnormal Gait & Station: unsteady Patient leans: Front and N/A  Psychiatric Specialty Exam: Physical Exam Neurological:     Mental Status: She is alert and oriented to person, place, and time.     Review of Systems  Blood pressure 92/61, pulse 84, temperature 98.1 F (36.7 C), temperature source Oral, resp. rate 18, height 5\' 5"  (1.651 m), weight 63.5 kg, SpO2 100 %.Body mass index is 23.3 kg/m.  General Appearance: Casual  Eye Contact:  Good  Speech:  Clear and Coherent  Volume:  Normal  Mood:  Anxious  Affect:  Appropriate  Thought Process:  Coherent  Orientation:  Full (Time, Place, and Person)  Thought  Content:  WDL  Suicidal Thoughts:  No  Homicidal Thoughts:  No  Memory:  Immediate;   Fair Recent;   Fair  Judgement:  Intact  Insight:  Present  Psychomotor Activity:  Normal  Concentration:  Concentration: Good and Attention Span: Good  Recall:  Good  Fund of Knowledge:  Fair  Language:  Good  Akathisia:  No  Handed:  Right  AIMS (if indicated):     Assets:  Communication Skills Desire for Improvement Social Support  ADL's:  Intact  Cognition:  WNL  Sleep:  Number of Hours: 2.75   Assessment:  Patient is a 59 year old female with a past psychiatric history significant for opiate dependence, benzodiazepine dependence, chronic pain who was brought to the [facility] emergency department on 01/13/2020 after an intentional overdose of 10 baclofen tablets. Her mood is better today but c/o stomachache and has not passed stools in 4 days. Complains of back pain. Urine drug screen came positive for benzodiazepines and Tetrahydrocannabinol.   Treatment Plan Summary: Daily contact with patient to assess and evaluate symptoms and progress in treatment.  Plan: 1.  Continue opiate detox protocol. 2.  Start duloxetine 60 mg p.o. daily for depression and anxiety. 3.  Continue Protonix 40 mg p.o. daily  for gastric protection. 4.  Do KUB for severe constipation 5.  Continue seizure precautions. 6.  Continue trazodone 50 mg p.o. nightly as needed insomnia. 7. Start Pregabalin 25 mg BID. 8.  Disposition planning-in progress.  Arnoldo Lenis, MD 01/16/2020, 3:21 PM
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
## 2020-01-16 NOTE — Progress Notes (Signed)
Pt continues to be hypotensive tonight and c/o being light-headed, nauseated, and rates pain a 4 on a scale of 0-10 (10 being the highest) in her bilateral legs and back. BP was 97/64 sitting and pulse 63 at 2135. BP was 82/64 and pulse 80 standing at 2136. PO fluids continue to be encouraged and pt was provided 480 mLs of gatorade. Pt was administered zofran at 2006 and robaxin at 2141. Pt appears to be resting in her bed with her eyes closed now. No distress has been noted.

## 2020-01-16 NOTE — Progress Notes (Signed)
Psychoeducational Group Note  Date:  01/15/2020 Time: 2100  Group Topic/Focus:  wrap up group  Participation Level: Did Not Attend  Participation Quality:  Not Applicable  Affect:  Not Applicable  Cognitive:  Not Applicable  Insight:  Not Applicable  Engagement in Group: Not Applicable  Additional Comments:  Pt was notified that group was beginning but remained in bed.   Marcille Buffy 01/16/2020, 4:39 AM

## 2020-01-16 NOTE — [facility] Suicide Risk Assessment (Addendum)
[facility] INPATIENT:  Family/Significant Other Suicide Prevention Education  Suicide Prevention Education:  Education Completed; Mother Cyprus Azeez,  (name of family member/significant other) has been identified by the patient as the family member/significant other with whom the patient will be residing, and identified as the person(s) who will aid the patient in the event of a mental health crisis (suicidal ideations/suicide attempt).  With written consent from the patient, the family member/significant other has been provided the following suicide prevention education, prior to the and/or following the discharge of the patient.  The suicide prevention education provided includes the following:  Suicide risk factors  Suicide prevention and interventions  National Suicide Hotline telephone number  [facility] assessment telephone number  [facility] Emergency Assistance 911  [facility] and/or Residential Mobile Crisis Unit telephone number  Request made of family/significant other to:  Remove weapons (e.g., guns, rifles, knives), all items previously/currently identified as safety concern.    Remove drugs/medications (over-the-counter, prescriptions, illicit drugs), all items previously/currently identified as a safety concern.  The family member/significant other verbalizes understanding of the suicide prevention education information provided.  The family member/significant other agrees to remove the items of safety concern listed above.  Mother confirmed she could pick pt up upon discharge and pt could stay at mothers home. Mom had concerns about pt smoking and asked that pt don't do it while at the house. Mom also agreed that mom or dad would transport pt to obtain methadone   Jabbar Palmero P Christain [place] 01/16/2020, 1:15 PM

## 2020-01-17 ENCOUNTER — Inpatient Hospital Stay (HOSPITAL_COMMUNITY)
Admit: 2020-01-17 | Discharge: 2020-01-17 | Disposition: A | Discharge status: Home / Self Care | Payer: BLUE CROSS/BLUE SHIELD | Attending: Psychiatry | Admitting: Psychiatry

## 2020-01-17 MED ORDER — RA SALINE ENEMA 19-7 GM/118ML RE ENEM
1.0000 "application " | ENEMA | Freq: Every day | RECTAL | Status: DC | PRN
  Administered 2020-01-17: 1 via RECTAL
  Filled 2020-01-17 (×2): qty 1

## 2020-01-17 NOTE — Progress Notes (Signed)
Pt presents with anxiety and depression. Pt has been unable to have a BM so pt was sent to [facility] for imaging.  No obstruction found, pt was ordered enema.  Pt took zofran for nausea and declined enema at this time (1545). Pt denies SI/HI/AVH.  RN administered medications per orders and offered support and guidance.  Pt thanked RN for listening to pt and taking care of pt.  Q 15 min safety checks remain in place.

## 2020-01-17 NOTE — Progress Notes (Addendum)
   01/17/20 1950  Psych Admission Type (Psych Patients Only)  Admission Status Voluntary  Psychosocial Assessment  Patient Complaints Depression;Substance abuse  Eye Contact Brief  Facial Expression Flat;Pained  Affect Preoccupied  Speech Soft;Slow;Logical/coherent  Interaction Isolative;Assertive  Motor Activity Slow;Unsteady  Appearance/Hygiene Unremarkable  Behavior Characteristics Cooperative;Appropriate to situation;Calm  Mood Depressed;Pleasant  Thought Process  Coherency WDL  Content WDL  Delusions None reported or observed  Perception WDL  Hallucination None reported or observed  Judgment Poor  Confusion None  Danger to Self  Current suicidal ideation? Denies  Danger to Others  Danger to Others None reported or observed   Pt remains resting in bed and isolative to her room. She c/o nausea, lower back pain rated a 4 on a scale of 0-10 (10 being the worse), and abdominal cramping. Her PRN tylenol, bentyl, and zofran were administered. PO fluids continue to be encouraged due to her hypotension. She denies feeling dizzy or light-headed, but remains on high fall risk protocol. Pt denies feeling constipated anymore because she reports the enema she received earlier during the day was effective. She shared that her goal is to "stay clean 1 day at a time." Pt praised for her positive thinking and provided active listening, reassurance, and support. Q 15 min safety checks continue. Pt's safety has been maintained.

## 2020-01-17 NOTE — Progress Notes (Signed)
Pt has been asleep this entire shift so far. Pt safety maintained on the unit.

## 2020-01-17 NOTE — Progress Notes (Signed)
   01/17/20 0126  Psych Admission Type (Psych Patients Only)  Admission Status Voluntary  Psychosocial Assessment  Patient Complaints Substance abuse  Eye Contact Brief  Facial Expression Flat;Pained  Affect Preoccupied  Speech Soft;Slow;Logical/coherent  Interaction Minimal;Isolative  Motor Activity Slow;Unsteady  Appearance/Hygiene Unremarkable  Behavior Characteristics Cooperative  Mood Depressed  Thought Process  Coherency WDL  Content WDL  Delusions None reported or observed  Judgment Poor  Confusion None  Danger to Self  Current suicidal ideation? Denies  Danger to Others  Danger to Others None reported or observed   Pt awoke and went to the bathroom. "They say that I can't go back to sleep because I'm sleeping too much but I'm having stomach pains. They woke me up." Pt denies lightheadedness or pain other than what she is feeling in her abdomen. Pt denies trouble going walking to the bathroom on her own. Pt denies SI, HI, AVH. Pt given bentyl 20 mg and trazodone 50 mg to help her go back to sleep. Will continue to monitor.

## 2020-01-17 NOTE — Progress Notes (Signed)
   01/17/20 1950  COVID-19 Daily Checkoff  Have you had a fever (temp > 37.80C/100F)  in the past 24 hours?  No  COVID-19 EXPOSURE  Have you traveled outside the state in the past 14 days? No  Have you been in contact with someone with a confirmed diagnosis of COVID-19 or PUI in the past 14 days without wearing appropriate PPE? No  Have you been living in the same home as a person with confirmed diagnosis of COVID-19 or a PUI (household contact)? No  Have you been diagnosed with COVID-19? No

## 2020-01-17 NOTE — Progress Notes (Signed)
[facility] MD Progress Note  01/17/2020 10:59 AM Sydney [name]  MRN:  [id number]   Subjective:  Patient states she is feeling better than yesterday. She adds that her overdose was intentional suicide attempt and she wanted her all pain to be over. She states admitting in unit is giving her time to realize her mistakes and not been on methadone helping her to realize where her actual pains are present ( knee, back). She denies any suicidal ideation but admits of being depressed. She sates she feels "guilty" about her current situation and the pain she is inflicting on her family. She complains of " stomach pains" and planning to do enema today. She states she takes dulcolax tablets daily for bowel movement from last 7 years. She also complains of lightheadedness. She states she wants methadone but putting her energy to not go back on it. She states overall she is feeling positive and better and don't want to family as they will try to impose her with their opinions. Denies HI or AVH.  Objective: Patient is seen and examined. Patient is a 59 year old female with a past psychiatric history significant for opiate dependence, benzodiazepine dependence, chronic pain who was brought to the [facility] emergency department on 01/13/2020 after an intentional overdose of 10 baclofen tablets. She is alert, pleasant, cooperative and oriented today. She is smiling more but has restricted affect. She was tearful and cried twice during the conversation. She agreed to go for an abdominal Xray to check any obstruction causing constipation. She has low blood pressure 82/49 and other vitals are normal.   Principal Problem: <principal problem not specified> Diagnosis: Active Problems:   Intentional overdose of drug in tablet form (HCC)  Total Time spent with patient: 25 minutes  Past Psychiatric History: See H & P  Past Medical History:  Past Medical History:  Diagnosis Date   Anxiety    Back pain    C. difficile colitis     Chronic back pain    Depression    Hepatitis C    Opiate dependence (HCC)    detox 06/2012   Sciatic pain     Past Surgical History:  Procedure Laterality Date   ABDOMINAL HYSTERECTOMY     APPENDECTOMY     CHOLECYSTECTOMY     LEFT OOPHORECTOMY     Family History:  Family History  Adopted: Yes   Family Psychiatric  History: See H & P Social History:  Social History   Substance and Sexual Activity  Alcohol Use No     Social History   Substance and Sexual Activity  Drug Use Yes   Types: Marijuana   Comment: THC 3/day; unknown amount    Social History   Socioeconomic History   Marital status: Single    Spouse name: Not on file   Number of children: Not on file   Years of education: Not on file   Highest education level: Not on file  Occupational History   Not on file  Tobacco Use   Smoking status: Current Every Day Smoker    Packs/day: 1.00    Types: Cigarettes   Smokeless tobacco: Never Used  Substance and Sexual Activity   Alcohol use: No   Drug use: Yes    Types: Marijuana    Comment: THC 3/day; unknown amount   Sexual activity: Not Currently    Birth control/protection: Surgical  Other Topics Concern   Not on file  Social History Narrative   Not on file  Social Determinants of Health   Financial Resource Strain:    Difficulty of Paying Living Expenses:   Food Insecurity:    Worried About Programme researcher, broadcasting/film/video in the Last Year:    Barista in the Last Year:   Transportation Needs:    Freight forwarder (Medical):    Lack of Transportation (Non-Medical):   Physical Activity:    Days of Exercise per Week:    Minutes of Exercise per Session:   Stress:    Feeling of Stress :   Social Connections:    Frequency of Communication with Friends and Family:    Frequency of Social Gatherings with Friends and Family:    Attends Religious Services:    Active Member of Clubs or Organizations:    Attends  Banker Meetings:    Marital Status:    Additional Social History:                         Sleep: Good  Appetite:  Good  Current Medications: Current Facility-Administered Medications  Medication Dose Route Frequency Provider Last Rate Last Admin   acetaminophen (TYLENOL) tablet 650 mg  650 mg Oral Q6H PRN Antonieta Pert, MD   650 mg at 01/16/20 0312   alum & mag hydroxide-simeth (MAALOX/MYLANTA) 200-200-20 MG/5ML suspension 30 mL  30 mL Oral Q4H PRN Antonieta Pert, MD       dicyclomine (BENTYL) tablet 20 mg  20 mg Oral Q6H PRN Antonieta Pert, MD   20 mg at 01/17/20 0123   DULoxetine (CYMBALTA) DR capsule 60 mg  60 mg Oral Daily Antonieta Pert, MD   60 mg at 01/17/20 0827   hydrOXYzine (ATARAX/VISTARIL) tablet 25 mg  25 mg Oral TID PRN Antonieta Pert, MD   25 mg at 01/16/20 0747   loperamide (IMODIUM) capsule 2-4 mg  2-4 mg Oral PRN Antonieta Pert, MD       magnesium hydroxide (MILK OF MAGNESIA) suspension 15 mL  15 mL Oral Daily PRN Antonieta Pert, MD       magnesium hydroxide (MILK OF MAGNESIA) suspension 30 mL  30 mL Oral Daily PRN Antonieta Pert, MD       methocarbamol (ROBAXIN) tablet 500 mg  500 mg Oral Q8H PRN Antonieta Pert, MD   500 mg at 01/17/20 0630   naproxen (NAPROSYN) tablet 500 mg  500 mg Oral BID PRN Antonieta Pert, MD       ondansetron (ZOFRAN-ODT) disintegrating tablet 4 mg  4 mg Oral Q6H PRN Antonieta Pert, MD   4 mg at 01/15/20 2006   pantoprazole (PROTONIX) EC tablet 40 mg  40 mg Oral Daily Antonieta Pert, MD   40 mg at 01/17/20 0827   pregabalin (LYRICA) capsule 25 mg  25 mg Oral BID Antonieta Pert, MD   25 mg at 01/17/20 1443   sodium phosphate (FLEET) 7-19 GM/118ML enema 1 enema  1 enema Rectal Daily PRN Antonieta Pert, MD       traZODone (DESYREL) tablet 50 mg  50 mg Oral QHS PRN Antonieta Pert, MD   50 mg at 01/17/20 0123    Lab Results: No results found for this  or any previous visit (from the past 48 hour(s)).  Blood Alcohol level:  Lab Results  Component Value Date   ETH <10 01/13/2020   ETH <5 12/16/2016    Metabolic Disorder  Labs: No results found for: HGBA1C, MPG No results found for: PROLACTIN No results found for: CHOL, TRIG, HDL, CHOLHDL, VLDL, LDLCALC  Physical Findings: AIMS:  , ,  ,  ,    CIWA:  CIWA-Ar Total: 10 COWS:  COWS Total Score: 2  Musculoskeletal: Strength & Muscle Tone: abnormal Gait & Station: unsteady Patient leans: Front  Psychiatric Specialty Exam: Physical Exam Neurological:     Mental Status: She is alert and oriented to person, place, and time.     Review of Systems  Blood pressure (!) 82/49, pulse 64, temperature 98.2 F (36.8 C), temperature source Oral, resp. rate 18, height 5\' 5"  (1.651 m), weight 63.5 kg, SpO2 100 %.Body mass index is 23.3 kg/m.  General Appearance: Casual  Eye Contact:  Good  Speech:  Slow  Volume:  Decreased  Mood:  Anxious and Depressed  Affect:  Restricted  Thought Process:  Coherent and Goal Directed  Orientation:  Full (Time, Place, and Person)  Thought Content:  WDL  Suicidal Thoughts:  No  Homicidal Thoughts:  No  Memory:  Immediate;   Fair  Judgement:  Fair  Insight:  Fair  Psychomotor Activity:  Normal  Concentration:  Concentration: Good  Recall:  Fair  Fund of Knowledge:  Good  Language:  Good  Akathisia:  No  Handed:  Right  AIMS (if indicated):     Assets:  Communication Skills Desire for Improvement Resilience Social Support  ADL's:  Intact  Cognition:  WNL  Sleep:  Number of Hours: 6   Assessment:   Patient is a 59 year old female with a past psychiatric history significant for opiate dependence, benzodiazepine dependence, chronic pain who was brought to the [facility] emergency department on 01/13/2020 after an intentional overdose of 10 baclofen tablets. Patients mood is dysphoric, guilty, tearful, restricted affect and committed that suicide  attempt was intentional. C/O stomachache and lightheadedness.Back pain and knee pain are better than yesterday.   Diagnosis:  1. Opiate dependence 2. Benzodiazepine dependence 3. Intentional overdose of baclofen 4. Unspecified depression   Treatment Plan Summary: Daily contact with patient to assess and evaluate symptoms and progress in treatment   Plan:  1. Continue opiate detox protocol. 2. Continue duloxetine 60 mg p.o. daily for depression and anxiety. 3. Continue Protonix 40 mg p.o. daily for gastric protection. 4. Waiting for KUB results. 5. Continue seizure precautions. 6. Continue trazodone 50 mg p.o. nightly as needed insomnia. 7. Continue Pregabalin 25 mg BID. 8. Start Methocarbamol 500 mg Q8H for muscle relaxation. 9. Disposition planning-in progress.  03/15/2020, MD 01/17/2020, 10:59 AM

## 2020-01-17 NOTE — Progress Notes (Signed)
Pt reports that she feels much better after enema was administered.  Pt said she feels "all cleaned out now."  RN notified Dr. Gerarda Fraction of this.

## 2020-01-18 DIAGNOSIS — F332 Major depressive disorder, recurrent severe without psychotic features: Principal | ICD-10-CM

## 2020-01-18 MED ORDER — MIRTAZAPINE 7.5 MG PO TABS
7.5000 mg | ORAL_TABLET | Freq: Every day | ORAL | Status: DC
  Administered 2020-01-18 – 2020-01-19 (×2): 7.5 mg via ORAL
  Filled 2020-01-18: qty 1
  Filled 2020-01-18: qty 7
  Filled 2020-01-18 (×2): qty 1

## 2020-01-18 MED ORDER — MELATONIN 5 MG PO TABS
5.0000 mg | ORAL_TABLET | Freq: Every day | ORAL | Status: DC
  Administered 2020-01-18 – 2020-01-19 (×2): 5 mg via ORAL
  Filled 2020-01-18 (×4): qty 1
  Filled 2020-01-18: qty 7
  Filled 2020-01-18: qty 1

## 2020-01-18 MED ORDER — PREGABALIN 50 MG PO CAPS
50.0000 mg | ORAL_CAPSULE | Freq: Two times a day (BID) | ORAL | Status: DC
  Administered 2020-01-18 – 2020-01-20 (×4): 50 mg via ORAL
  Filled 2020-01-18 (×4): qty 1

## 2020-01-18 MED FILL — Sodium Phosphates - Enema: RECTAL | Qty: 1 | Status: AC

## 2020-01-18 NOTE — BHH Group Notes (Signed)
[facility] LCSW Group Therapy Note  Date/Time:    01/18/2020 10:00-11:00AM  Type of Therapy and Topic:  Group Therapy:  Healthy vs Unhealthy Coping Skills  Participation Level:  Active   Description of Group:  The focus of this group was to determine what unhealthy coping techniques typically are used by group members and what healthy coping techniques would be helpful in coping with various problems. Patients were guided in becoming aware of the differences between healthy and unhealthy coping techniques.  Patients were asked to identify 1-2 healthy coping skills they would like to learn to use more effectively, and many mentioned meditation, breathing, and relaxation.  At the end of group, additional ideas of healthy coping skills were shared in a fun exercise.  Therapeutic Goals Patients learned that coping is what human beings do all day long to deal with various situations in their lives Patients defined and discussed healthy vs unhealthy coping techniques Patients identified their preferred coping techniques and identified whether these were healthy or unhealthy Patients determined 1-2 healthy coping skills they would like to become more familiar with and use more often Patients provided support and ideas to each other  Summary of Patient Progress: During group, patient expressed that healthy skills she currently employs include spending time with her grandson and accepting help by staying in the hospital even though she had originally wanted to leave.  Unhealthy coping mechanisms she typically uses are people pleasing, isolation ("My #1"), neglecting herself and drug use.  She talked extensively during group about how she has not known how she felt for years, and this is the first time she is feeling pain, physical or emotional, in years because it is the first time she has allowed herself to feel.  She stated she is "not sure I want to know the real me."  She was encouraged to learn more about her  diagnosis and to learn what to expect and look for.  She was insightful and interactive throughout group.   Therapeutic Modalities Cognitive Behavioral Therapy Motivational Interviewing   Ambrose Mantle, LCSW 01/18/2020, 9:17 AM

## 2020-01-18 NOTE — Progress Notes (Signed)
   01/18/20 2213  COVID-19 Daily Checkoff  Have you had a fever (temp > 37.80C/100F)  in the past 24 hours?  No  If you have had runny nose, nasal congestion, sneezing in the past 24 hours, has it worsened? No  COVID-19 EXPOSURE  Have you traveled outside the state in the past 14 days? No  Have you been in contact with someone with a confirmed diagnosis of COVID-19 or PUI in the past 14 days without wearing appropriate PPE? No  Have you been living in the same home as a person with confirmed diagnosis of COVID-19 or a PUI (household contact)? No  Have you been diagnosed with COVID-19? No

## 2020-01-18 NOTE — Progress Notes (Signed)
Pt did not attend wrap-up group   

## 2020-01-18 NOTE — BHH Group Notes (Signed)
Pt did not attend wrap up group this evening. Pt was in bed resting. 

## 2020-01-18 NOTE — Progress Notes (Signed)
[facility] MD Progress Note  01/18/2020 1:35 PM Sydney [name]  MRN:  [id number]   Subjective: Sydney [name] reports, "My mood [name] somewhat better, but [name]'m hurting real bad. My back, my legs feel like they are on fire. [name] don't see much help for what [name]'m going through. [name] don't know whether to stay on the Methadone or give it up completely. [name] sleep very poorly because [name] don't get a break from pain. The only medicines that had ever helped me sleep better were Melatonin & Mirtazapine together".  Objective: Patient [name] seen and examined. Patient [name] a 59 year old female with a past psychiatric history significant for opiate dependence, benzodiazepine dependence, chronic pain who was brought to the [facility] emergency department on 01/13/2020 after an intentional overdose of 10 baclofen tablets.  Sydney [name] [name] seen, chart reviewed. The chart findings discussed with the treatment team. She presents alert, oriented x 3 & aware of situation today. She [name] complaining of feeling very uncomfortable because of her chronic pain issues. She says she [name] not doing well at all. She says she does not see any help for her pain. She [name] contemplating whether to stay or give up on Methadone all together. She says she has not been sleeping at all at night. She says Trazodone has never helped her sleep. She adds that the only two combination medications that has ever helped her were Melatonin & Mirtazapine. She [name] taking & tolerating her current treatment regimen, although she says they are not very helpful. She denies any SIHI, AVH, delusional thoughts or paranoia. She does not appear to be responding to any internal stimuli. She had gone for an abdominal Xray to check for any obstruction causing constipation. The result was unremarkable. The staff reports that patient had a good bowel movement yesterday. Her vitals are within norm. Sydney [name] in agreement to continue current plan of care as already in progress. Started on both Melatonin & Mirtazapine.    Principal Problem: Major depressive disorder, recurrent episode, severe (HCC)  Diagnosis: Principal Problem:   Major depressive disorder, recurrent episode, severe (HCC) Active Problems:   Intentional overdose of drug in tablet form (HCC)  Total Time spent with patient: 15 minutes  Past Psychiatric History: See H & P  Past Medical History:  Past Medical History:  Diagnosis Date  . Anxiety   . Back pain   . C. difficile colitis   . Chronic back pain   . Depression   . Hepatitis C   . Opiate dependence (HCC)    detox 06/2012  . Sciatic pain     Past Surgical History:  Procedure Laterality Date  . ABDOMINAL HYSTERECTOMY    . APPENDECTOMY    . CHOLECYSTECTOMY    . LEFT OOPHORECTOMY     Family History:  Family History  Adopted: Yes   Family Psychiatric  History: See H & P  Social History:  Social History   Substance and Sexual Activity  Alcohol Use No     Social History   Substance and Sexual Activity  Drug Use Yes  . Types: Marijuana   Comment: THC 3/day; unknown amount    Social History   Socioeconomic History  . Marital status: Single    Spouse name: Not on file  . Number of children: Not on file  . Years of education: Not on file  . Highest education level: Not on file  Occupational History  . Not on file  Tobacco Use  . Smoking status: Current Every  Day Smoker    Packs/day: 1.00    Types: Cigarettes  . Smokeless tobacco: Never Used  Substance and Sexual Activity  . Alcohol use: No  . Drug use: Yes    Types: Marijuana    Comment: THC 3/day; unknown amount  . Sexual activity: Not Currently    Birth control/protection: Surgical  Other Topics Concern  . Not on file  Social History Narrative  . Not on file   Social Determinants of Health   Financial Resource Strain:   . Difficulty of Paying Living Expenses:   Food Insecurity:   . Worried About Programme researcher, broadcasting/film/videounning Out of Food in the Last Year:   . Baristaan Out of Food in the Last Year:   Transportation  Needs:   . Freight forwarderLack of Transportation (Medical):   Sydney [name] Kitchen. Lack of Transportation (Non-Medical):   Physical Activity:   . Days of Exercise per Week:   . Minutes of Exercise per Session:   Stress:   . Feeling of Stress :   Social Connections:   . Frequency of Communication with Friends and Family:   . Frequency of Social Gatherings with Friends and Family:   . Attends Religious Services:   . Active Member of Clubs or Organizations:   . Attends BankerClub or Organization Meetings:   Sydney [name] Kitchen. Marital Status:    Additional Social History:   Sleep: Good  Appetite:  Good  Current Medications: Current Facility-Administered Medications  Medication Dose Route Frequency Provider Last Rate Last Admin  . acetaminophen (TYLENOL) tablet 650 mg  650 mg Oral Q6H PRN Sydney [name], Sydney Lawson, MD   650 mg at 01/17/20 2121  . alum & mag hydroxide-simeth (MAALOX/MYLANTA) 200-200-20 MG/5ML suspension 30 mL  30 mL Oral Q4H PRN Sydney [name], Sydney Lawson, MD      . dicyclomine (BENTYL) tablet 20 mg  20 mg Oral Q6H PRN Sydney [name], Sydney Lawson, MD   20 mg at 01/17/20 2121  . DULoxetine (CYMBALTA) DR capsule 60 mg  60 mg Oral Daily Sydney [name], Sydney Lawson, MD   60 mg at 01/18/20 0820  . hydrOXYzine (ATARAX/VISTARIL) tablet 25 mg  25 mg Oral TID PRN Sydney [name], Sydney Lawson, MD   25 mg at 01/16/20 0747  . loperamide (IMODIUM) capsule 2-4 mg  2-4 mg Oral PRN Sydney [name], Sydney Lawson, MD      . magnesium hydroxide (MILK OF MAGNESIA) suspension 15 mL  15 mL Oral Daily PRN Sydney [name], Sydney Lawson, MD      . magnesium hydroxide (MILK OF MAGNESIA) suspension 30 mL  30 mL Oral Daily PRN Sydney [name], Sydney Lawson, MD      . melatonin tablet 5 mg  5 mg Oral QHS Sydney [name] I, NP      . methocarbamol (ROBAXIN) tablet 500 mg  500 mg Oral Q8H PRN Sydney [name], Sydney Lawson, MD   500 mg at 01/17/20 0630  . mirtazapine (REMERON) tablet 7.5 mg  7.5 mg Oral QHS Sydney [name] I, NP      . naproxen (NAPROSYN) tablet 500 mg  500 mg Oral BID PRN Sydney [name], Sydney Lawson, MD   500 mg at 01/17/20 1654  .  ondansetron (ZOFRAN-ODT) disintegrating tablet 4 mg  4 mg Oral Q6H PRN Sydney [name], Sydney Lawson, MD   4 mg at 01/17/20 2121  . pantoprazole (PROTONIX) EC tablet 40 mg  40 mg Oral Daily Sydney [name], Sydney Lawson, MD   40 mg at 01/18/20 0820  . pregabalin (LYRICA) capsule 25 mg  25 mg Oral BID Sydney [name], Sydney Lawson, MD  25 mg at 01/18/20 0820  . RA Saline Enema 19-7 GM/118ML ENEM 1 application  1 application Rectal Daily PRN Sydney Pert, MD   1 application at 01/17/20 1744   Lab Results: No results found for this or any previous visit (from the past 48 hour(s)).  Blood Alcohol level:  Lab Results  Component Value Date   ETH <10 01/13/2020   ETH <5 12/16/2016   Metabolic Disorder Labs: No results found for: HGBA1C, MPG No results found for: PROLACTIN No results found for: CHOL, TRIG, HDL, CHOLHDL, VLDL, LDLCALC  Physical Findings: AIMS:  , ,  ,  ,    CIWA:  CIWA-Ar Total: 10 COWS:  COWS Total Score: 4  Musculoskeletal: Strength & Muscle Tone: abnormal Gait & Station: unsteady Patient leans: Front  Psychiatric Specialty Exam: Physical Exam Vitals and nursing note reviewed.  HENT:     Head: Normocephalic.     Nose: Nose normal.     Mouth/Throat:     Pharynx: Oropharynx [name] clear.  Eyes:     Pupils: Pupils are equal, round, and reactive to light.  Cardiovascular:     Rate and Rhythm: Normal rate.     Pulses: Normal pulses.  Pulmonary:     Effort: Pulmonary effort [name] normal.  Genitourinary:    Comments: Deferred Musculoskeletal:        General: Normal range of motion.     Cervical back: Normal range of motion.  Skin:    General: Skin [name] dry.  Neurological:     Mental Status: She [name] alert and oriented to person, place, and time.     Review of Systems  Constitutional: Negative for chills, diaphoresis and fever.  HENT: Negative for congestion, rhinorrhea, sneezing and sore throat.   Eyes: Negative for discharge.  Respiratory: Negative for chest tightness, shortness of breath  and wheezing.   Cardiovascular: Negative for chest pain and palpitations.  Gastrointestinal: Negative for diarrhea, nausea and vomiting.  Endocrine: Negative for cold intolerance.  Genitourinary: Negative for difficulty urinating.  Musculoskeletal: Positive for arthralgias (Chronic), back pain (Chronic) and myalgias (Chronic).  Allergic/Immunologic: Negative for environmental allergies and food allergies.       Allergies: Haldol, Nubain  Neurological: Negative for dizziness, tremors, seizures, syncope, speech difficulty, light-headedness, numbness and headaches.  Psychiatric/Behavioral: Positive for dysphoric mood. Negative for agitation, behavioral problems, confusion, decreased concentration, hallucinations, self-injury, sleep disturbance and suicidal ideas. The patient [name] not nervous/anxious and [name] not hyperactive.     Blood pressure 110/79, pulse 89, temperature 98.2 F (36.8 C), temperature source Oral, resp. rate 18, height 5\' 5"  (1.651 m), weight 63.5 kg, SpO2 100 %.Body mass index [name] 23.3 kg/m.  General Appearance: Casual  Eye Contact:  Good  Speech:  Slow  Volume:  Decreased  Mood:  Anxious and Depressed,   Affect:  Restricted  Thought Process:  Coherent and Goal Directed  Orientation:  Full (Time, Place, and Person)  Thought Content:  WDL  Suicidal Thoughts:  No  Homicidal Thoughts:  No  Memory:  Immediate;   Fair  Judgement:  Fair  Insight:  Fair  Psychomotor Activity:  Normal  Concentration:  Concentration: Good  Recall:  Fair  Fund of Knowledge:  Good  Language:  Good  Akathisia:  No  Handed:  Right  AIMS (if indicated):     Assets:  Communication Skills Desire for Improvement Resilience Social Support  ADL's:  Intact  Cognition:  WNL  Sleep:  Number of Hours: 5   Assessment:  Patient [name] a 59 year old female with a past psychiatric history significant for opiate dependence, benzodiazepine dependence, chronic pain issues, was brought to the [facility] emergency  department on 01/13/2020 after an intentional overdose on 10 baclofen tablets. Patients mood [name] dysphoric, guilty, tearful, restricted affect & reported that her suicide attempt was intentional. Today, she continues to complain of severe pain episodes. Says she [name] depressed because she can't get any pain relief.  Diagnosis: 1. Opiate dependence 2. Benzodiazepine dependence 3. Intentional overdose of baclofen 4. Unspecified depression  Treatment Plan Summary: Daily contact with patient to assess and evaluate symptoms and progress in treatment.   - Continue inpatient hospitalization. - Will continue today 01/18/2020 plan as below except where it [name] noted.  Plan: 1. - Continue opiate detox protocol. 2. - Continue duloxetine 60 mg p.o. daily for depression/anxiety. 3. - Continue Protonix 40 mg p.o. daily for gastric protection. 4. - KUB results has indicated no obvious blockage. 5. - Continue seizure precautions. 6. - Discontinued trazodone 50 mg per patient request.       - Initiated Mirtazapine 7.5 mg po Q hs for insomnia       & Melatonin 5 mg po Q hs for insomnia. 7. - Continue Pregabalin 25 mg BID. 8. - Continue  Methocarbamol 500 mg Q8H for muscle relaxation.  9. Disposition planning-in progress. 10 Encourage group participation.  Sydney Stammer, NP, PMHNP, FNP-BC 01/18/2020, 1:35 PMPatient ID: Sydney [name], female   DOB: 09-Nov-1960, 59 y.o.   MRN: [id number]

## 2020-01-18 NOTE — Progress Notes (Signed)
Pt denies SI/HI/AVH.  Stated that she is having back pain and pt took scheduled Lyrica.  Pt reported that she has not been sleeping well.  Pt will talk to provider about pain and sleep concerns.  Pt reported that her mood has improved since admission.  RN provided support and assessed for needs/concerns.  RN administered medications per provider orders.  Q 15 min safety checks remain in place.

## 2020-01-18 NOTE — Progress Notes (Signed)
   01/18/20 2216  Psych Admission Type (Psych Patients Only)  Admission Status Voluntary  Psychosocial Assessment  Patient Complaints Anxiety  Eye Contact Brief  Facial Expression Flat  Affect Appropriate to circumstance  Speech Logical/coherent  Interaction Assertive  Motor Activity Slow  Appearance/Hygiene Unremarkable  Behavior Characteristics Appropriate to situation  Thought Process  Coherency WDL  Content WDL  Delusions None reported or observed  Perception WDL  Hallucination None reported or observed  Judgment Poor  Confusion None  Danger to Self  Current suicidal ideation? Denies  Danger to Others  Danger to Others None reported or observed

## 2020-01-19 NOTE — Progress Notes (Signed)
   01/19/20 2253  COVID-19 Daily Checkoff  Have you had a fever (temp > 37.80C/100F)  in the past 24 hours?  No  If you have had runny nose, nasal congestion, sneezing in the past 24 hours, has it worsened? No  COVID-19 EXPOSURE  Have you traveled outside the state in the past 14 days? No  Have you been in contact with someone with a confirmed diagnosis of COVID-19 or PUI in the past 14 days without wearing appropriate PPE? No  Have you been living in the same home as a person with confirmed diagnosis of COVID-19 or a PUI (household contact)? No  Have you been diagnosed with COVID-19? No

## 2020-01-19 NOTE — Progress Notes (Signed)
[facility] MD Progress Note  01/19/2020 3:37 PM Sydney [name]  MRN:  [id number] Subjective:  Patient states her mood is "just Ok". On the scale of 1-10, 10 being really depressed, she placed her mood on 5. She states she wants to go home as lying here is not helping her mood in anyway. She denies any suicidal ideation today. She states she slept better last night. Her appetite is good. She gave verbal consent to talk to her mother. She does not report any other concerns today, denies any side effects from medications. She states she still is thinking about Methadone and is not sure about it.  Objective: Patient is seen and examined. Patient is a 59 year old female with a past psychiatric history significant for opiate dependence, benzodiazepine dependence, chronic pain who was brought to the [facility] emergency department on 01/13/2020 after an intentional overdose of 10 baclofen tablets. Patient is alert, cooperative and oriented. She looks uncomfortable because of pain in back and not able to see without her glasses. She is contemplating whether to stay or give up on Methadone all together. She was tearful and dysphoric during the interview.Her vitals are stable today, no new labs.  Tried calling mother multiple times ( 11 times), no answer from her side and voicemail is not set up so could not leave a message. Going to try again later in the evening. Principal Problem: Major depressive disorder, recurrent episode, severe (HCC) Diagnosis: Principal Problem:   Major depressive disorder, recurrent episode, severe (HCC) Active Problems:   Intentional overdose of drug in tablet form (HCC)  Total Time spent with patient: 25 minutes  Past Psychiatric History: See H & P  Past Medical History:  Past Medical History:  Diagnosis Date  . Anxiety   . Back pain   . C. difficile colitis   . Chronic back pain   . Depression   . Hepatitis C   . Opiate dependence (HCC)    detox 06/2012  . Sciatic pain      Past Surgical History:  Procedure Laterality Date  . ABDOMINAL HYSTERECTOMY    . APPENDECTOMY    . CHOLECYSTECTOMY    . LEFT OOPHORECTOMY     Family History:  Family History  Adopted: Yes   Family Psychiatric  History: See H & P Social History:  Social History   Substance and Sexual Activity  Alcohol Use No     Social History   Substance and Sexual Activity  Drug Use Yes  . Types: Marijuana   Comment: THC 3/day; unknown amount    Social History   Socioeconomic History  . Marital status: Single    Spouse name: Not on file  . Number of children: Not on file  . Years of education: Not on file  . Highest education level: Not on file  Occupational History  . Not on file  Tobacco Use  . Smoking status: Current Every Day Smoker    Packs/day: 1.00    Types: Cigarettes  . Smokeless tobacco: Never Used  Substance and Sexual Activity  . Alcohol use: No  . Drug use: Yes    Types: Marijuana    Comment: THC 3/day; unknown amount  . Sexual activity: Not Currently    Birth control/protection: Surgical  Other Topics Concern  . Not on file  Social History Narrative  . Not on file   Social Determinants of Health   Financial Resource Strain:   . Difficulty of Paying Living Expenses:   Food Insecurity:   .  Worried About Programme researcher, broadcasting/film/video in the Last Year:   . Barista in the Last Year:   Transportation Needs:   . Freight forwarder (Medical):   Marland Kitchen Lack of Transportation (Non-Medical):   Physical Activity:   . Days of Exercise per Week:   . Minutes of Exercise per Session:   Stress:   . Feeling of Stress :   Social Connections:   . Frequency of Communication with Friends and Family:   . Frequency of Social Gatherings with Friends and Family:   . Attends Religious Services:   . Active Member of Clubs or Organizations:   . Attends Banker Meetings:   Marland Kitchen Marital Status:    Additional Social History:                          Sleep: Good  Appetite:  Good  Current Medications: Current Facility-Administered Medications  Medication Dose Route Frequency Provider Last Rate Last Admin  . acetaminophen (TYLENOL) tablet 650 mg  650 mg Oral Q6H PRN Antonieta Pert, MD   650 mg at 01/17/20 2121  . alum & mag hydroxide-simeth (MAALOX/MYLANTA) 200-200-20 MG/5ML suspension 30 mL  30 mL Oral Q4H PRN Antonieta Pert, MD      . dicyclomine (BENTYL) tablet 20 mg  20 mg Oral Q6H PRN Antonieta Pert, MD   20 mg at 01/18/20 2118  . DULoxetine (CYMBALTA) DR capsule 60 mg  60 mg Oral Daily Antonieta Pert, MD   60 mg at 01/19/20 0755  . hydrOXYzine (ATARAX/VISTARIL) tablet 25 mg  25 mg Oral TID PRN Antonieta Pert, MD   25 mg at 01/18/20 1500  . loperamide (IMODIUM) capsule 2-4 mg  2-4 mg Oral PRN Antonieta Pert, MD      . magnesium hydroxide (MILK OF MAGNESIA) suspension 15 mL  15 mL Oral Daily PRN Antonieta Pert, MD      . magnesium hydroxide (MILK OF MAGNESIA) suspension 30 mL  30 mL Oral Daily PRN Antonieta Pert, MD      . melatonin tablet 5 mg  5 mg Oral QHS Armandina Stammer I, NP   5 mg at 01/18/20 2118  . methocarbamol (ROBAXIN) tablet 500 mg  500 mg Oral Q8H PRN Antonieta Pert, MD   500 mg at 01/18/20 1500  . mirtazapine (REMERON) tablet 7.5 mg  7.5 mg Oral QHS Nwoko, Agnes I, NP   7.5 mg at 01/18/20 2117  . naproxen (NAPROSYN) tablet 500 mg  500 mg Oral BID PRN Antonieta Pert, MD   500 mg at 01/17/20 1654  . ondansetron (ZOFRAN-ODT) disintegrating tablet 4 mg  4 mg Oral Q6H PRN Antonieta Pert, MD   4 mg at 01/17/20 2121  . pantoprazole (PROTONIX) EC tablet 40 mg  40 mg Oral Daily Antonieta Pert, MD   40 mg at 01/19/20 0755  . pregabalin (LYRICA) capsule 50 mg  50 mg Oral BID Antonieta Pert, MD   50 mg at 01/19/20 0755  . RA Saline Enema 19-7 GM/118ML ENEM 1 application  1 application Rectal Daily PRN Antonieta Pert, MD   1 application at 01/17/20 1744    Lab Results: No  results found for this or any previous visit (from the past 48 hour(s)).  Blood Alcohol level:  Lab Results  Component Value Date   ETH <10 01/13/2020   ETH <5 12/16/2016  Metabolic Disorder Labs: No results found for: HGBA1C, MPG No results found for: PROLACTIN No results found for: CHOL, TRIG, HDL, CHOLHDL, VLDL, LDLCALC  Physical Findings: AIMS: Facial and Oral Movements Muscles of Facial Expression: None, normal Lips and Perioral Area: None, normal Jaw: None, normal Tongue: None, normal,Extremity Movements Upper (arms, wrists, hands, fingers): None, normal Lower (legs, knees, ankles, toes): None, normal, Trunk Movements Neck, shoulders, hips: None, normal, Overall Severity Severity of abnormal movements (highest score from questions above): None, normal Incapacitation due to abnormal movements: None, normal Patient's awareness of abnormal movements (rate only patient's report): No Awareness, Dental Status Current problems with teeth and/or dentures?: No Does patient usually wear dentures?: No  CIWA:  CIWA-Ar Total: 10 COWS:  COWS Total Score: 0  Musculoskeletal: Strength & Muscle Tone: abnormal Gait & Station: unsteady Patient leans: Front  Psychiatric Specialty Exam: Physical Exam Neurological:     Mental Status: She is alert and oriented to person, place, and time.     Review of Systems  Blood pressure 110/79, pulse 89, temperature 98.2 F (36.8 C), temperature source Oral, resp. rate 18, height 5\' 5"  (1.651 m), weight 63.5 kg, SpO2 100 %.Body mass index is 23.3 kg/m.  General Appearance: Casual  Eye Contact:  Good  Speech:  Slow  Volume:  Decreased  Mood:  Depressed  Affect:  Appropriate  Thought Process:  Coherent  Orientation:  Full (Time, Place, and Person)  Thought Content:  WDL  Suicidal Thoughts:  No  Homicidal Thoughts:  No  Memory:  Immediate;   Good  Judgement:  Fair  Insight:  Fair  Psychomotor Activity:  Normal  Concentration:   Concentration: Good  Recall:  Fair  Fund of Knowledge:  Fair  Language:  Good  Akathisia:  No  Handed:  Right  AIMS (if indicated):     Assets:  Communication Skills Desire for Improvement Social Support  ADL's:  Intact  Cognition:  WNL  Sleep:  Number of Hours: 6.75   Assessment: Patient is a 59 year old female with a past psychiatric history significant for opiate dependence, benzodiazepine dependence, chronic pain issues, was brought to the [facility] emergency department on 01/13/2020 after an intentional overdose on 10 baclofen tablets. Patients mood is dysphoric, guilty, tearful, restricted affect. Today, she says she is depressed because she can't get any pain relief and lying in hospital is not helping her. Overall patient looks better, seen chatting with other patients in day room, walking around in hall.  Diagnosis: 1. Opiate dependence 2. Benzodiazepine dependence 3. Intentional overdose of baclofen 4. Unspecified depression  Treatment Plan Summary: Daily contact with patient to assess and evaluate symptoms and progress in treatment.    Plan: 1. Continue opiate detox protocol. 2.Continueduloxetine 60 mg p.o. daily for depression/anxiety. 3. Continue Protonix 40 mg p.o. daily for gastric protection. 4.Continue seizure precautions. 5. Continue Mirtazapine 7.5 mg po Q hs for insomnia 6. Continue Melatonin 5 mg po Q hs for insomnia. 7. Continue Pregabalin 25 mg BID. 8.Continue  Methocarbamol 500 mg Q8H for muscle relaxation. 9. Encouragement to attend group therapies. 10. Disposition in progress.    03/15/2020, MD 01/19/2020, 3:37 PM

## 2020-01-19 NOTE — Plan of Care (Signed)
  Problem: Activity: Goal: Interest or engagement in activities will improve 01/19/2020 1431 by Angela Adam, RN Outcome: Adequate for Discharge 01/19/2020 1425 by Angela Adam, RN Outcome: Adequate for Discharge   Problem: Activity: Goal: Sleeping patterns will improve 01/19/2020 1431 by Angela Adam, RN Outcome: Adequate for Discharge 01/19/2020 1425 by Angela Adam, RN Outcome: Adequate for Discharge   Problem: Coping: Goal: Ability to verbalize frustrations and anger appropriately will improve Outcome: Adequate for Discharge   Problem: Health Behavior/Discharge Planning: Goal: Identification of resources available to assist in meeting health care needs will improve Outcome: Adequate for Discharge  D: Patient appears vested in her treatment. She is attending groups and interacting well in the milieu. She denies any thought of self-harm. Patient presents with flat affect; however, brightens upon approach. She is knowledgeable regarding her medications. Patient complains of some lower back pain; however, has not needed any medications for pain.  A: Continue to monitor medication management and MD orders.  Safety checks completed every 15 minutes per protocol.  Offer support and encouragement as needed.  R: Patient is receptive to staff; her behavior is appropriate.

## 2020-01-19 NOTE — Progress Notes (Signed)
°   01/19/20 2255  Psych Admission Type (Psych Patients Only)  Admission Status Voluntary  Psychosocial Assessment  Patient Complaints Anxiety  Eye Contact Brief  Facial Expression Flat  Affect Appropriate to circumstance  Speech Logical/coherent  Interaction Minimal  Motor Activity Slow  Appearance/Hygiene Unremarkable  Behavior Characteristics Appropriate to situation  Mood Pleasant  Thought Process  Coherency WDL  Content WDL  Delusions None reported or observed  Perception WDL  Hallucination None reported or observed  Judgment WDL  Confusion None  Danger to Self  Current suicidal ideation? Denies  Danger to Others  Danger to Others None reported or observed

## 2020-01-19 NOTE — Progress Notes (Signed)
   01/19/20 1021  COVID-19 Daily Checkoff  Have you had a fever (temp > 37.80C/100F)  in the past 24 hours?  No  If you have had runny nose, nasal congestion, sneezing in the past 24 hours, has it worsened? No  COVID-19 EXPOSURE  Have you traveled outside the state in the past 14 days? No  Have you been in contact with someone with a confirmed diagnosis of COVID-19 or PUI in the past 14 days without wearing appropriate PPE? No  Have you been living in the same home as a person with confirmed diagnosis of COVID-19 or a PUI (household contact)? No  Have you been diagnosed with COVID-19? No

## 2020-01-19 NOTE — [facility] Group Notes (Signed)
[facility] LCSW Group Therapy Note  Date/Time:  01/19/2020 9:00-10:00 or 10:00-11:00AM  Type of Therapy and Topic:  Group Therapy:  Healthy and Unhealthy Supports  Participation Level:  Active   Description of Group:  Patients in this group were introduced to the idea of adding a variety of healthy supports to address the various needs in their lives.Patients discussed what additional healthy supports could be helpful in their recovery and wellness after discharge in order to prevent future hospitalizations.   An emphasis was placed on using counselor, doctor, therapy groups, 12-step groups, and problem-specific support groups to expand supports.  The group really focused in large part on what has happened to patients when they have neglected themselves in favor of taking care of other people, and how that has not only NOT worked in the past, it will never be in their best interest to ignore their own needs. It was emphasized that, in fact, if a person ignores their own needs, they actually do not have the ability to care for anybody else at all in the long run.  This was discussed at length by various group members, and everyone listened attentively.  There were a lot of tears in group.  Therapeutic Goals:   1)  discuss importance of adding supports to stay well once out of the hospital  2)  compare healthy versus unhealthy supports and identify some examples of each  3)  generate ideas and descriptions of healthy supports that can be added  4)  offer mutual support about how to address unhealthy supports  5)  encourage active participation in and adherence to discharge plan    Summary of Patient Progress:  The patient expressed a willingness to add Alcoholics Anonymous as support(s) to help in her recovery journey.  She has tried Narcotics Anonymous in the past but found it to mostly be drug dealers looking for targets.  She had a sponsor in the past and found that was very helpful.  She was  very active throughout group, was insightful about her own lack of self-support, and was supportive of other patients in a positive manner.     Therapeutic Modalities:   Motivational Interviewing Brief Solution-Focused Therapy  Ambrose Mantle, LCSW

## 2020-01-19 NOTE — Progress Notes (Signed)
[facility] Group Notes:  (Nursing/MHT/Case Management/Adjunct)  Date:  01/19/2020  Time:  2000  Type of Therapy:  wrap up group  Participation Level:  Active  Participation Quality:  Appropriate, Attentive, Sharing and Supportive  Affect:  Blunted  Cognitive:  Appropriate  Insight:  Improving  Engagement in Group:  Engaged  Modes of Intervention:  Clarification, Education and Support  Summary of Progress/Problems: Positive thinking and self-care were discussed.   Marcille Buffy 01/19/2020, 9:40 PM

## 2020-01-19 NOTE — Progress Notes (Signed)
°   01/19/20 0800  Psych Admission Type (Psych Patients Only)  Admission Status Voluntary  Psychosocial Assessment  Patient Complaints Anxiety  Eye Contact Brief  Facial Expression Flat  Affect Preoccupied  Speech Logical/coherent  Interaction Isolative  Motor Activity Slow  Appearance/Hygiene Unremarkable  Behavior Characteristics Appropriate to situation  Mood Pleasant  Thought Process  Coherency WDL  Content WDL  Delusions None reported or observed  Perception WDL  Hallucination None reported or observed  Judgment Poor  Confusion None  Danger to Self  Current suicidal ideation? Denies  Danger to Others  Danger to Others None reported or observed

## 2020-01-20 MED ORDER — PREGABALIN 50 MG PO CAPS: 50.0000 mg | ORAL_CAPSULE | Freq: Two times a day (BID) | ORAL | 0 refills | Status: DC

## 2020-01-20 MED ORDER — DULOXETINE HCL 60 MG PO CPEP: 60.0000 mg | ORAL_CAPSULE | Freq: Every day | ORAL | 0 refills | Status: AC

## 2020-01-20 MED ORDER — MELATONIN 5 MG PO TABS: 5.0000 mg | ORAL_TABLET | Freq: Every day | ORAL | 0 refills | Status: AC

## 2020-01-20 MED ORDER — PREGABALIN 50 MG PO CAPS: 50.0000 mg | ORAL_CAPSULE | Freq: Two times a day (BID) | ORAL | 0 refills | Status: AC

## 2020-01-20 MED ORDER — MIRTAZAPINE 7.5 MG PO TABS: 7.5000 mg | ORAL_TABLET | Freq: Every day | ORAL | 0 refills | Status: AC

## 2020-01-20 MED ORDER — PANTOPRAZOLE SODIUM 40 MG PO TBEC: 40.0000 mg | DELAYED_RELEASE_TABLET | Freq: Every day | ORAL | 0 refills | Status: AC

## 2020-01-20 NOTE — Progress Notes (Signed)
Discharge Note:  Patient discharged home with parents.  Patient denied SI and HI.  Denied A/V hallucinations.  Suicide prevention information given and discussed with patient who stated she understood and had no questions.  Patient stated she received all her belongings, clothing, toiletries, misc items, etc.  Patient stated she appreciated all assistance from [facility] staff.  All required discharge information given to patient at discharge.

## 2020-01-20 NOTE — Discharge Summary (Addendum)
Physician Discharge Summary Note  Patient:  Sydney [name] is an 59 y.o., female MRN:  [id number] DOB:  1961/02/21 Patient phone:  [phone] (home)  Patient address:   [address] [place] [id number],  Total Time spent with patient: 30 minutes  Date of Admission:  01/14/2020 Date of Discharge: 01/20/2020  Reason for Admission:  Patient is a 59 year old female with a past psychiatric history significant for opiate dependence, benzodiazepine dependence, chronic pain who was brought to the [facility] emergency department on 01/13/20 after an intentional overdose of 10 baclofen tablets. She was admitted to inpatient unit for further evaluation and stabilization.  Principal Problem: Major depressive disorder, recurrent episode, severe (HCC) Discharge Diagnoses: Principal Problem:   Major depressive disorder, recurrent episode, severe (HCC) Active Problems:   Intentional overdose of drug in tablet form [facility](HCC)   Past Psychiatric History: Patient admitted to previous psychiatric admissions. The most recent admission she could remember was within the last 2 months and [facility] for substance rehabilitation. She stated she had been on antidepressants in the past, but the only one that she could remember right now was Cymbalta. That was per her pain clinic. She stated that she had overdosed in the past, but would not elaborate.  Past Medical History:  Past Medical History:  Diagnosis Date  . Anxiety   . Back pain   . C. difficile colitis   . Chronic back pain   . Depression   . Hepatitis C   . Opiate dependence (HCC)    detox 06/2012  . Sciatic pain     Past Surgical History:  Procedure Laterality Date  . ABDOMINAL HYSTERECTOMY    . APPENDECTOMY    . CHOLECYSTECTOMY    . LEFT OOPHORECTOMY     Family History:  Family History  Adopted: Yes   Family Psychiatric  History: Not pertinent Social History:  Social History   Substance and Sexual Activity  Alcohol Use No     Social  History   Substance and Sexual Activity  Drug Use Yes  . Types: Marijuana   Comment: THC 3/day; unknown amount    Social History   Socioeconomic History  . Marital status: Single    Spouse name: Not on file  . Number of children: Not on file  . Years of education: Not on file  . Highest education level: Not on file  Occupational History  . Not on file  Tobacco Use  . Smoking status: Current Every Day Smoker    Packs/day: 1.00    Types: Cigarettes  . Smokeless tobacco: Never Used  Substance and Sexual Activity  . Alcohol use: No  . Drug use: Yes    Types: Marijuana    Comment: THC 3/day; unknown amount  . Sexual activity: Not Currently    Birth control/protection: Surgical  Other Topics Concern  . Not on file  Social History Narrative  . Not on file   Social Determinants of Health   Financial Resource Strain:   . Difficulty of Paying Living Expenses:   Food Insecurity:   . Worried About Programme researcher, broadcasting/film/videounning Out of Food in the Last Year:   . Baristaan Out of Food in the Last Year:   Transportation Needs:   . Freight forwarderLack of Transportation (Medical):   Marland Kitchen. Lack of Transportation (Non-Medical):   Physical Activity:   . Days of Exercise per Week:   . Minutes of Exercise per Session:   Stress:   . Feeling of Stress :   Social  Connections:   . Frequency of Communication with Friends and Family:   . Frequency of Social Gatherings with Friends and Family:   . Attends Religious Services:   . Active Member of Clubs or Organizations:   . Attends Banker Meetings:   Marland Kitchen Marital Status:     Hospital Course:   On 01/14/20- Patient was admitted to the inpatient unit and examined. She was placed on lorazepam 1 mg p.o. every 6 hours as needed for a CIWA greater than 10. She should be out of the woods with regard to opiate withdrawal, but because of additional concerns we put in place the [facility] detox protocol. Placed on seizure precautions. She does have a history of hepatitis C that has not  been treated. Review of her admission laboratories revealed a mildly low potassium at 3.4, mildly elevated glucose of 146. Her AST was 46 and her ALT was 64. Her white blood cell count was mildly elevated 11.3, but platelets and the rest of her CBC was normal. Her acetaminophen was less than 10. Blood alcohol was less than 10.  Restart her Cymbalta at 30 mg p.o. daily.  For her mood she was started on duloxetine 60 mg PO daily and pregabalin 25 mg BID for her anxiety. She had severe constipation, so KUB was ordered for her. Also, Methocarbamol 500 mg Q8 h was started for muscle relaxation. Trazodone 50 mg was stopped on patient's request and Mirtazapine 7.5 mg PO QHS and Melatonin 5 mg PO QHS was started for insomnia. She slept well that night and the next day as well.  During the course of his hospitalization, the 15-minute checks were adequate to ensure patient's safety. Patient did not display any dangerous, violent or suicidal behavior on the unit.  She interacted with patients & staff appropriately and participated appropriately in the group therapy sessions. Her medications were addressed & adjusted to meet his needs.  At the time of discharge, patient is not reporting any acute suicidal ideation & feels more confident about her self-care and managing suicidal thoughts/mental health.  Denies homicidal ideations.  She explained about her plan to go back for Methadone clinic. She is going to discuss it further with her mother.Education and supportive counseling provided. She was able to engage in safety planning including plan to return to [facility] or contact emergency services if he feels unable to maintain her own safety or the safety of others. Pt had no further questions, comments, or concerns.    Physical Findings: AIMS: Facial and Oral Movements Muscles of Facial Expression: None, normal Lips and Perioral Area: None, normal Jaw: None, normal Tongue: None, normal,Extremity Movements Upper (arms,  wrists, hands, fingers): None, normal Lower (legs, knees, ankles, toes): None, normal, Trunk Movements Neck, shoulders, hips: None, normal, Overall Severity Severity of abnormal movements (highest score from questions above): None, normal Incapacitation due to abnormal movements: None, normal Patient's awareness of abnormal movements (rate only patient's report): No Awareness, Dental Status Current problems with teeth and/or dentures?: No Does patient usually wear dentures?: No  CIWA:  CIWA-Ar Total: 10 COWS:  COWS Total Score: 0  Musculoskeletal: Strength & Muscle Tone: abnormal Gait & Station: unsteady Patient leans: Front  Psychiatric Specialty Exam: Physical Exam Musculoskeletal:     Cervical back: Normal range of motion.     Comments: Arthralgias, Back pain, knee pain, unsteady gait  Neurological:     Mental Status: She is alert and oriented to person, place, and time.  Psychiatric:  Behavior: Behavior normal.        Thought Content: Thought content normal.        Judgment: Judgment normal.     Review of Systems  Blood pressure 111/84, pulse 82, temperature 98 F (36.7 C), temperature source Oral, resp. rate 18, height 5\' 5"  (1.651 m), weight 63.5 kg, SpO2 100 %.Body mass index is 23.3 kg/m.  General Appearance: Casual  Eye Contact:  Good  Speech:  Slow  Volume:  Decreased  Mood:  Anxious  Affect:  Appropriate  Thought Process:  Coherent  Orientation:  Full (Time, Place, and Person)  Thought Content:  WDL  Suicidal Thoughts:  No  Homicidal Thoughts:  No  Memory:  Immediate;   Good Recent;   Good  Judgement:  Intact  Insight:  Good  Psychomotor Activity:  Normal  Concentration:  Concentration: Good and Attention Span: Good  Recall:  Fair  Fund of Knowledge:  Fair  Language:  Good  Akathisia:  No  Handed:  Right  AIMS (if indicated):     Assets:  Communication Skills Desire for Improvement Resilience Social Support  ADL's:  Intact  Cognition:  WNL   Sleep:  Number of Hours: 6     Have you used any form of tobacco in the last 30 days? (Cigarettes, Smokeless Tobacco, Cigars, and/or Pipes): Yes  Has this patient used any form of tobacco in the last 30 days? (Cigarettes, Smokeless Tobacco, Cigars, and/or Pipes) No  Blood Alcohol level:  Lab Results  Component Value Date   ETH <10 01/13/2020   ETH <5 12/16/2016    Metabolic Disorder Labs:  No results found for: HGBA1C, MPG No results found for: PROLACTIN No results found for: CHOL, TRIG, HDL, CHOLHDL, VLDL, LDLCALC  See Psychiatric Specialty Exam and Suicide Risk Assessment completed by Attending Physician prior to discharge.  Discharge destination:  Home  Is patient on multiple antipsychotic therapies at discharge:  No   Has Patient had three or more failed trials of antipsychotic monotherapy by history:  No  Recommended Plan for Multiple Antipsychotic Therapies: NA  Discharge Instructions    Discharge instructions   Complete by: As directed    Patient is instructed to take all prescribed medications as recommended. Report any side effects or adverse reactions to your outpatient psychiatrist. Patient is instructed to abstain from alcohol and illegal drugs while on prescription medications. In the event of worsening symptoms, patient is instructed to call the crisis hotline, 911, or go to the nearest emergency department for evaluation and treatment.     Allergies as of 01/20/2020      Reactions   Haldol [haloperidol Lactate] Other (See Comments)   Pt felt like she was having a stroke   Nubain [nalbuphine Hcl] Itching, Swelling      Medication List    STOP taking these medications   cloNIDine 0.1 MG tablet Commonly known as: CATAPRES   dicyclomine 20 MG tablet Commonly known as: BENTYL   ibuprofen 600 MG tablet Commonly known as: ADVIL   loperamide 2 MG capsule Commonly known as: IMODIUM   methadone 10 MG/ML solution Commonly known as: DOLOPHINE    naproxen sodium 220 MG tablet Commonly known as: ALEVE   ondansetron 4 MG tablet Commonly known as: ZOFRAN     TAKE these medications     Indication  DULoxetine 60 MG capsule Commonly known as: CYMBALTA Take 1 capsule (60 mg total) by mouth daily. Start taking on: January 21, 2020  Indication: Major Depressive  Disorder   melatonin 5 MG Tabs Take 1 tablet (5 mg total) by mouth at bedtime.  Indication: Trouble Sleeping   mirtazapine 7.5 MG tablet Commonly known as: REMERON Take 1 tablet (7.5 mg total) by mouth at bedtime.  Indication: Major Depressive Disorder, Insomnia   pantoprazole 40 MG tablet Commonly known as: PROTONIX Take 1 tablet (40 mg total) by mouth daily. Start taking on: January 21, 2020  Indication: Gastroesophageal Reflux Disease   pregabalin 50 MG capsule Commonly known as: LYRICA Take 1 capsule (50 mg total) by mouth 2 (two) times daily.  Indication: Neuropathic Pain       Follow-up Information    [facility]. Go on 01/22/2020.   Why: Please go to this provider during their walk in hours for patient intake:  Wednesdays and Thursdays, from 5:00 am to 6:30 am.  Please bring your photo ID and your discharge summary. Contact information: [address] [place], [place] [address]  P: [phone] F: [phone]       [facility]. Go on 01/21/2020.   Why: You have an appointment for medication management services on 01/21/20 at 8:00 am.  This appointment will be held in person.   Contact information: [address] [place] [address] [phone]               Follow-up recommendations:  Activity:  As tolerated Diet:  Normal  Comments: Prescriptions given at discharge. Patient agreeable to plan. Given opportunity to ask questions. Appears to feel comfortable with discharge denies any current suicidal or homicidal thought. Patient is also instructed prior to discharge to: Take all medications as prescribed by  his/her mental healthcare provider. Report any adverse effects and or reactions from the medicines to his/her outpatient provider promptly. Patient has been instructed & cautioned: To not engage in alcohol and or illegal drug use while on prescription medicines. In the event of worsening symptoms, patient is instructed to call the crisis hotline, 911 and or go to the nearest ED for appropriate evaluation and treatment of symptoms. To follow-up with his/her primary care provider for your other medical issues, concerns and or health care needs.  Signed: Arnoldo Lenis, MD 01/20/2020, 10:30 AM

## 2020-01-20 NOTE — Progress Notes (Signed)
  [facility] Adult Case Management Discharge Plan :  Will you be returning to the same living situation after discharge:  No. Will go live with parents.  At discharge, do you have transportation home?: Yes,  mom Do you have the ability to pay for your medications: Yes,  medicaid   Release of information consent forms completed and in the chart;  Patient's signature needed at discharge.  Patient to Follow up at:  Follow-up Information    [facility]. Go on 01/22/2020.   Why: Please go to this provider during their walk in hours for patient intake:  Wednesdays and Thursdays, from 5:00 am to 6:30 am.  Please bring your photo ID and your discharge summary. Contact information: [address] [place], [place] [address]  P: [phone] F: [phone]       [facility]. Go on 01/21/2020.   Why: You have an appointment for medication management services on 01/21/20 at 8:00 am.  This appointment will be held in person.   Contact information: [address] [place] [address] [phone]               Next level of care provider has access to [facility] Link:no  Safety Planning and Suicide Prevention discussed: Yes, Yes  Have you used any form of tobacco in the last 30 days? (Cigarettes, Smokeless Tobacco, Cigars, and/or Pipes): Yes  Has patient been referred to the Quitline?: Patient refused referral  Patient has been referred for addiction treatment: Yes  Erin Sons, LCSW 01/20/2020, 10:57 AM

## 2020-01-20 NOTE — Progress Notes (Signed)
D:  Patient's self inventory sheet, patient has good sleep, sleep medication helpful.  Fair appetite, low energy level, poor concentration.  Rated depression, hopeless and anxiety 5.  Stated she has been agitated, runny nose, irritability.  Denied SI.  Back, knee problems, medications are helpful.  Goal is discharge.  Plans to return to parents home in [place]. A:  Medications administered per MD orders.  Emotional support and encouragement given patient. R:  Patient denied SI and HI.  Denied A/V hallucinations  Safety maintained with 15 minute checks.

## 2020-01-20 NOTE — Tx Team (Signed)
Interdisciplinary Treatment and Diagnostic Plan Update  01/20/2020 Time of Session: 9:10am Sydney [name] MRN: [id number]  Principal Diagnosis: Major depressive disorder, recurrent episode, severe (HCC)  Secondary Diagnoses: Principal Problem:   Major depressive disorder, recurrent episode, severe (HCC) Active Problems:   Intentional overdose of drug in tablet form (HCC)   Current Medications:  Current Facility-Administered Medications  Medication Dose Route Frequency Provider Last Rate Last Admin  . acetaminophen (TYLENOL) tablet 650 mg  650 mg Oral Q6H PRN Antonieta Pert, MD   650 mg at 01/20/20 0754  . alum & mag hydroxide-simeth (MAALOX/MYLANTA) 200-200-20 MG/5ML suspension 30 mL  30 mL Oral Q4H PRN Antonieta Pert, MD      . DULoxetine (CYMBALTA) DR capsule 60 mg  60 mg Oral Daily Antonieta Pert, MD   60 mg at 01/20/20 6834  . hydrOXYzine (ATARAX/VISTARIL) tablet 25 mg  25 mg Oral TID PRN Antonieta Pert, MD   25 mg at 01/18/20 1500  . magnesium hydroxide (MILK OF MAGNESIA) suspension 15 mL  15 mL Oral Daily PRN Antonieta Pert, MD      . magnesium hydroxide (MILK OF MAGNESIA) suspension 30 mL  30 mL Oral Daily PRN Antonieta Pert, MD      . melatonin tablet 5 mg  5 mg Oral QHS Armandina Stammer I, NP   5 mg at 01/19/20 2119  . mirtazapine (REMERON) tablet 7.5 mg  7.5 mg Oral QHS Nwoko, Agnes I, NP   7.5 mg at 01/19/20 2119  . pantoprazole (PROTONIX) EC tablet 40 mg  40 mg Oral Daily Antonieta Pert, MD   40 mg at 01/20/20 0752  . pregabalin (LYRICA) capsule 50 mg  50 mg Oral BID Antonieta Pert, MD   50 mg at 01/20/20 0754  . RA Saline Enema 19-7 GM/118ML ENEM 1 application  1 application Rectal Daily PRN Antonieta Pert, MD   1 application at 01/17/20 1744   PTA Medications: Medications Prior to Admission  Medication Sig Dispense Refill Last Dose  . cloNIDine (CATAPRES) 0.1 MG tablet Take 1 tablet (0.1 mg total) by mouth 2 (two) times daily. (Patient not  taking: Reported on 01/14/2020) 10 tablet 0   . dicyclomine (BENTYL) 20 MG tablet Take 1 tablet (20 mg total) by mouth every 6 (six) hours as needed for spasms. (Patient not taking: Reported on 01/14/2020) 12 tablet 0   . ibuprofen (ADVIL) 600 MG tablet Take 1 tablet (600 mg total) by mouth every 6 (six) hours as needed. (Patient not taking: Reported on 01/14/2020) 20 tablet 0   . loperamide (IMODIUM) 2 MG capsule Take 1 capsule (2 mg total) by mouth 4 (four) times daily as needed for diarrhea or loose stools. (Patient not taking: Reported on 01/14/2020) 12 capsule 0   . methadone (DOLOPHINE) 10 MG/ML solution Take 65 mg by mouth daily.      . naproxen sodium (ALEVE) 220 MG tablet Take 220 mg by mouth daily as needed (pain).     . ondansetron (ZOFRAN) 4 MG tablet Take 1 tablet (4 mg total) by mouth every 6 (six) hours. (Patient not taking: Reported on 01/14/2020) 12 tablet 0     Patient Stressors: Substance abuse  Patient Strengths: Average or above average intelligence Communication skills General fund of knowledge Motivation for treatment/growth Physical Health  Treatment Modalities: Medication Management, Group therapy, Case management,  1 to 1 session with clinician, Psychoeducation, Recreational therapy.   Physician Treatment Plan for Primary Diagnosis:  Major depressive disorder, recurrent episode, severe (HCC) Long Term Goal(s): Improvement in symptoms so as ready for discharge Improvement in symptoms so as ready for discharge   Short Term Goals: Ability to identify changes in lifestyle to reduce recurrence of condition will improve Ability to verbalize feelings will improve Ability to disclose and discuss suicidal ideas Ability to demonstrate self-control will improve Ability to identify and develop effective coping behaviors will improve Ability to maintain clinical measurements within normal limits will improve Ability to identify triggers associated with substance abuse/mental  health issues will improve Ability to identify changes in lifestyle to reduce recurrence of condition will improve Ability to verbalize feelings will improve Ability to disclose and discuss suicidal ideas Ability to demonstrate self-control will improve Ability to identify and develop effective coping behaviors will improve Ability to maintain clinical measurements within normal limits will improve Ability to identify triggers associated with substance abuse/mental health issues will improve  Medication Management: Evaluate patient's response, side effects, and tolerance of medication regimen.  Therapeutic Interventions: 1 to 1 sessions, Unit Group sessions and Medication administration.  Evaluation of Outcomes: Adequate for Discharge  Physician Treatment Plan for Secondary Diagnosis: Principal Problem:   Major depressive disorder, recurrent episode, severe (HCC) Active Problems:   Intentional overdose of drug in tablet form (HCC)  Long Term Goal(s): Improvement in symptoms so as ready for discharge Improvement in symptoms so as ready for discharge   Short Term Goals: Ability to identify changes in lifestyle to reduce recurrence of condition will improve Ability to verbalize feelings will improve Ability to disclose and discuss suicidal ideas Ability to demonstrate self-control will improve Ability to identify and develop effective coping behaviors will improve Ability to maintain clinical measurements within normal limits will improve Ability to identify triggers associated with substance abuse/mental health issues will improve Ability to identify changes in lifestyle to reduce recurrence of condition will improve Ability to verbalize feelings will improve Ability to disclose and discuss suicidal ideas Ability to demonstrate self-control will improve Ability to identify and develop effective coping behaviors will improve Ability to maintain clinical measurements within normal  limits will improve Ability to identify triggers associated with substance abuse/mental health issues will improve     Medication Management: Evaluate patient's response, side effects, and tolerance of medication regimen.  Therapeutic Interventions: 1 to 1 sessions, Unit Group sessions and Medication administration.  Evaluation of Outcomes: Adequate for Discharge   RN Treatment Plan for Primary Diagnosis: Major depressive disorder, recurrent episode, severe (HCC) Long Term Goal(s): Knowledge of disease and therapeutic regimen to maintain health will improve  Short Term Goals: Ability to remain free from injury will improve, Ability to identify and develop effective coping behaviors will improve and Compliance with prescribed medications will improve  Medication Management: RN will administer medications as ordered by provider, will assess and evaluate patient's response and provide education to patient for prescribed medication. RN will report any adverse and/or side effects to prescribing provider.  Therapeutic Interventions: 1 on 1 counseling sessions, Psychoeducation, Medication administration, Evaluate responses to treatment, Monitor vital signs and CBGs as ordered, Perform/monitor CIWA, COWS, AIMS and Fall Risk screenings as ordered, Perform wound care treatments as ordered.  Evaluation of Outcomes: Adequate for Discharge   LCSW Treatment Plan for Primary Diagnosis: Major depressive disorder, recurrent episode, severe (HCC) Long Term Goal(s): Safe transition to appropriate next level of care at discharge, Engage patient in therapeutic group addressing interpersonal concerns.  Short Term Goals: Engage patient in aftercare planning with referrals and  resources, Identify triggers associated with mental health/substance abuse issues and Increase skills for wellness and recovery  Therapeutic Interventions: Assess for all discharge needs, 1 to 1 time with Social worker, Explore available  resources and support systems, Assess for adequacy in community support network, Educate family and significant other(s) on suicide prevention, Complete Psychosocial Assessment, Interpersonal group therapy.  Evaluation of Outcomes: Adequate for Discharge   Progress in Treatment: Attending groups: No. Participating in groups: No. Taking medication as prescribed: Yes. Toleration medication: Yes. Family/Significant other contact made: No, will contact:  if consent is provided. Patient understands diagnosis: Yes. Discussing patient identified problems/goals with staff: Yes. Medical problems stabilized or resolved: No. Denies suicidal/homicidal ideation: Yes. Issues/concerns per patient self-inventory: No.  New problem(s) identified: No, Describe:  CSW will continue to assess.  New Short Term/Long Term Goal(s): medication stabilization, elimination of SI thoughts, development of comprehensive mental wellness plan.   Patient Goals:  "To get up and get out; do what I have to do"  Discharge Plan or Barriers: Patient recently admitted. CSW will continue to follow and assess for appropriate referrals and possible discharge planning.   Reason for Continuation of Hospitalization: Depression Medication stabilization Withdrawal symptoms  Estimated Length of Stay: 1-3 days  Attendees: Patient:  01/15/2020   Physician:  01/15/2020   Nursing:  01/15/2020   RN Care Manager: 01/15/2020   Social Worker: Ruthann Cancer, LCSWA 01/15/2020   Recreational Therapist:  01/15/2020   Other:  01/15/2020   Other:  01/15/2020   Other: 01/15/2020       Scribe for Treatment Team: Otelia Santee, LCSWA 01/20/2020 11:38 AM

## 2020-01-20 NOTE — [facility] Suicide Risk Assessment (Signed)
[facility] Discharge Suicide Risk Assessment   Principal Problem: Major depressive disorder, recurrent episode, severe (HCC) Discharge Diagnoses: Principal Problem:   Major depressive disorder, recurrent episode, severe (HCC) Active Problems:   Intentional overdose of drug in tablet form (HCC)   Total Time spent with patient: 20 minutes  Musculoskeletal: Strength & Muscle Tone: within normal limits Gait & Station: normal Patient leans: N/A  Psychiatric Specialty Exam: Review of Systems  Musculoskeletal: Positive for arthralgias, back pain, gait problem and myalgias.  All other systems reviewed and are negative.   Blood pressure 111/84, pulse 82, temperature 98 F (36.7 C), temperature source Oral, resp. rate 18, height 5\' 5"  (1.651 m), weight 63.5 kg, SpO2 100 %.Body mass index is 23.3 kg/m.  General Appearance: Casual  Eye Contact::  Fair  Speech:  Normal Rate409  Volume:  Normal  Mood:  Anxious  Affect:  Congruent  Thought Process:  Coherent and Descriptions of Associations: Intact  Orientation:  Full (Time, Place, and Person)  Thought Content:  Logical  Suicidal Thoughts:  No  Homicidal Thoughts:  No  Memory:  Immediate;   Good Recent;   Good Remote;   Good  Judgement:  Intact  Insight:  Fair  Psychomotor Activity:  Normal  Concentration:  Good  Recall:  Fair  Fund of Knowledge:Good  Language: Good  Akathisia:  Negative  Handed:  Right  AIMS (if indicated):     Assets:  Desire for Improvement Housing Resilience Social Support  Sleep:  Number of Hours: 6  Cognition: WNL  ADL's:  Intact   Mental Status Per Nursing Assessment::   On Admission:  NA  Demographic Factors:  Caucasian, Low socioeconomic status and Unemployed  Loss Factors: Decline in physical health  Historical Factors: Impulsivity  Risk Reduction Factors:   Living with another person, especially a relative and Positive social support  Continued Clinical Symptoms:  Depression:   Comorbid  alcohol abuse/dependence Impulsivity Alcohol/Substance Abuse/Dependencies  Cognitive Features That Contribute To Risk:  None    Suicide Risk:  Minimal: No identifiable suicidal ideation.  Patients presenting with no risk factors but with morbid ruminations; may be classified as minimal risk based on the severity of the depressive symptoms   Follow-up Information    [facility]. Go on 01/22/2020.   Why: Please go to this provider during their walk in hours for patient intake:  Wednesdays and Thursdays, from 5:00 am to 6:30 am.  Please bring your photo ID and your discharge summary. Contact information: [address] [place], [place]  P: [phone] F: [phone]       [facility]. Go on 01/21/2020.   Why: You have an appointment for medication management services on 01/21/20 at 8:00 am.  This appointment will be held in person.   Contact information: [address] [place] [phone]               Plan Of Care/Follow-up recommendations:  Activity:  ad lib  [phone], MD 01/20/2020, 8:25 AM

## 2020-01-20 NOTE — Progress Notes (Signed)
Spiritual care group on grief and loss facilitated by chaplain Burnis Kingfisher  Group Goal:  Support / Education around grief and loss  Members engage in facilitated group support and psycho-social education.  Group Description:  Following introductions and group rules, group members engaged in facilitated group dialog and support around topic of loss, with particular support around experiences of loss in their lives. Group Identified types of loss (relationships / self / things) and identified patterns, circumstances, and changes that precipitate losses. Reflected on thoughts / feelings around loss, normalized grief responses, and recognized variety in grief experience.  Patient Progress:  Present at beginning of group.  Left group after group introduction.

## 2020-01-20 NOTE — Progress Notes (Signed)
Recreation Therapy Notes  Date:  7.19.20 Time: 0945 Location: [address] Group Room  Group Topic: Stress Management  Goal Area(s) Addresses:  Patient will identify positive stress management techniques. Patient will identify benefits of using stress management post d/c.  Intervention: Stress Management   Activity : Meditation.  LRT played a meditation that focused on being resilient in the face of adversity.  Patients were to listen to meditation as it played to engage in activity.   Education:  Stress Management, Discharge Planning.   Education Outcome: Acknowledges Education  Clinical Observations/Feedback: Pt did not attend group session.    Caroll Rancher, LRT/CTRS         Lillia Abed, Vung Kush A 01/20/2020 11:28 AM

## 2022-01-14 IMAGING — DX DG ABDOMEN 2V
3 series · 3 of 3 positions shown · non-contrast
Comparison: June 15, 2013.

CLINICAL DATA: Abdominal pain.

EXAM:
ABDOMEN - 2 VIEW

[abdomen erect]
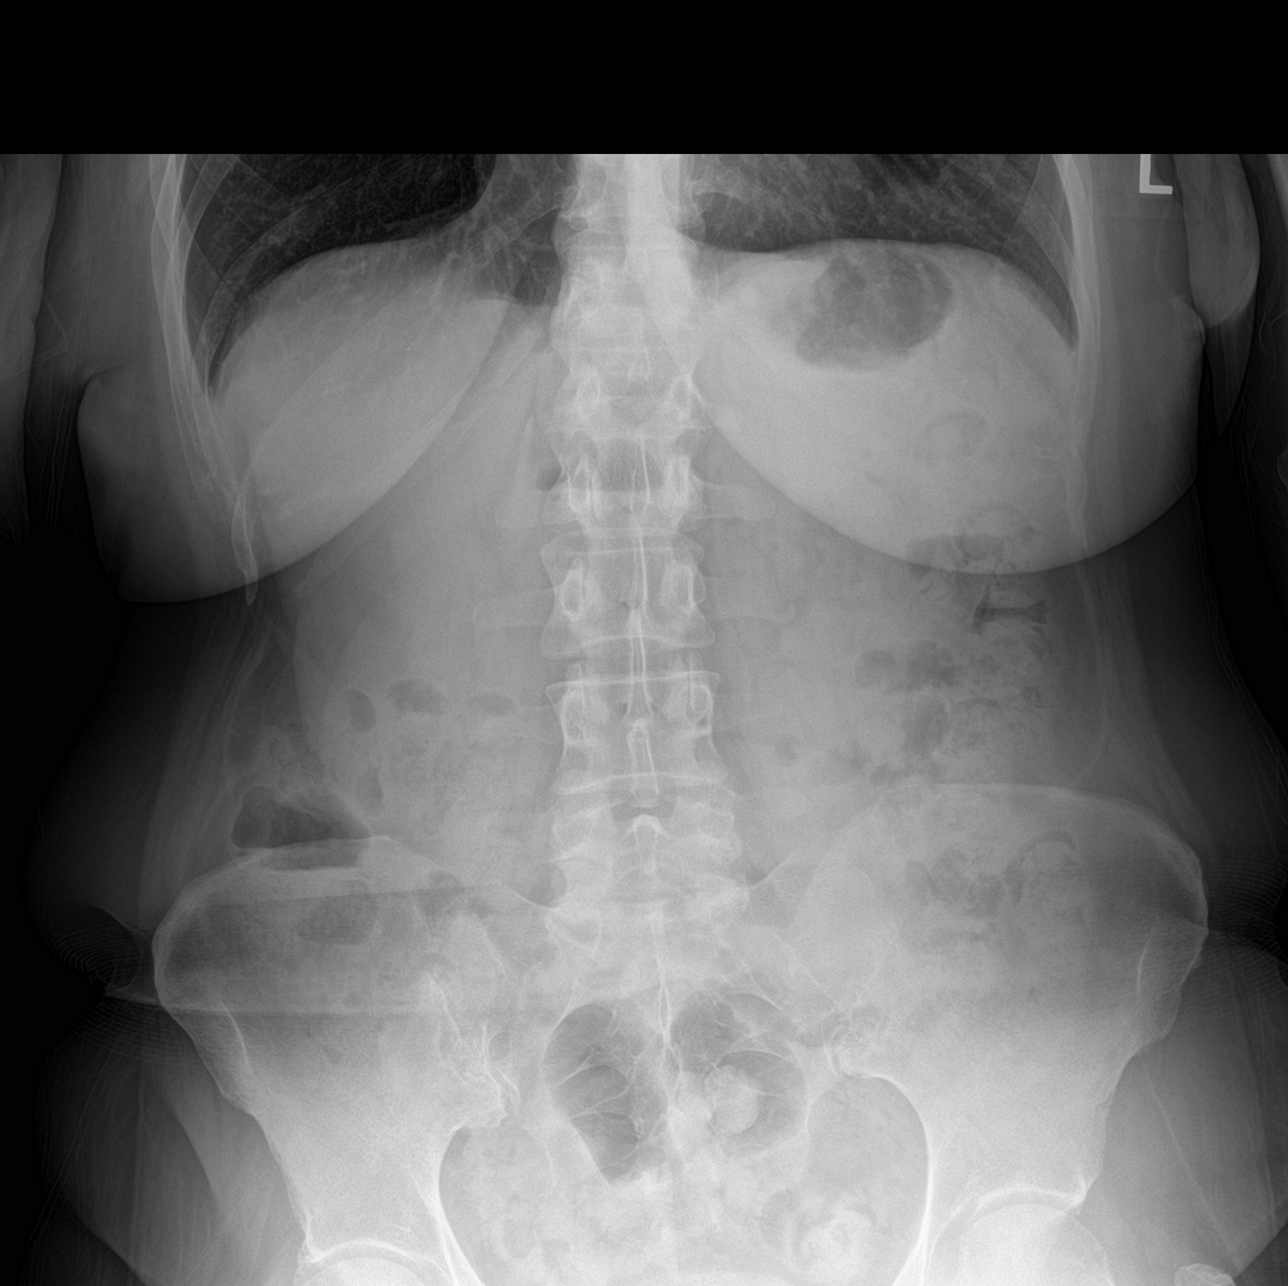

[abdomen supine (1 of 2)]
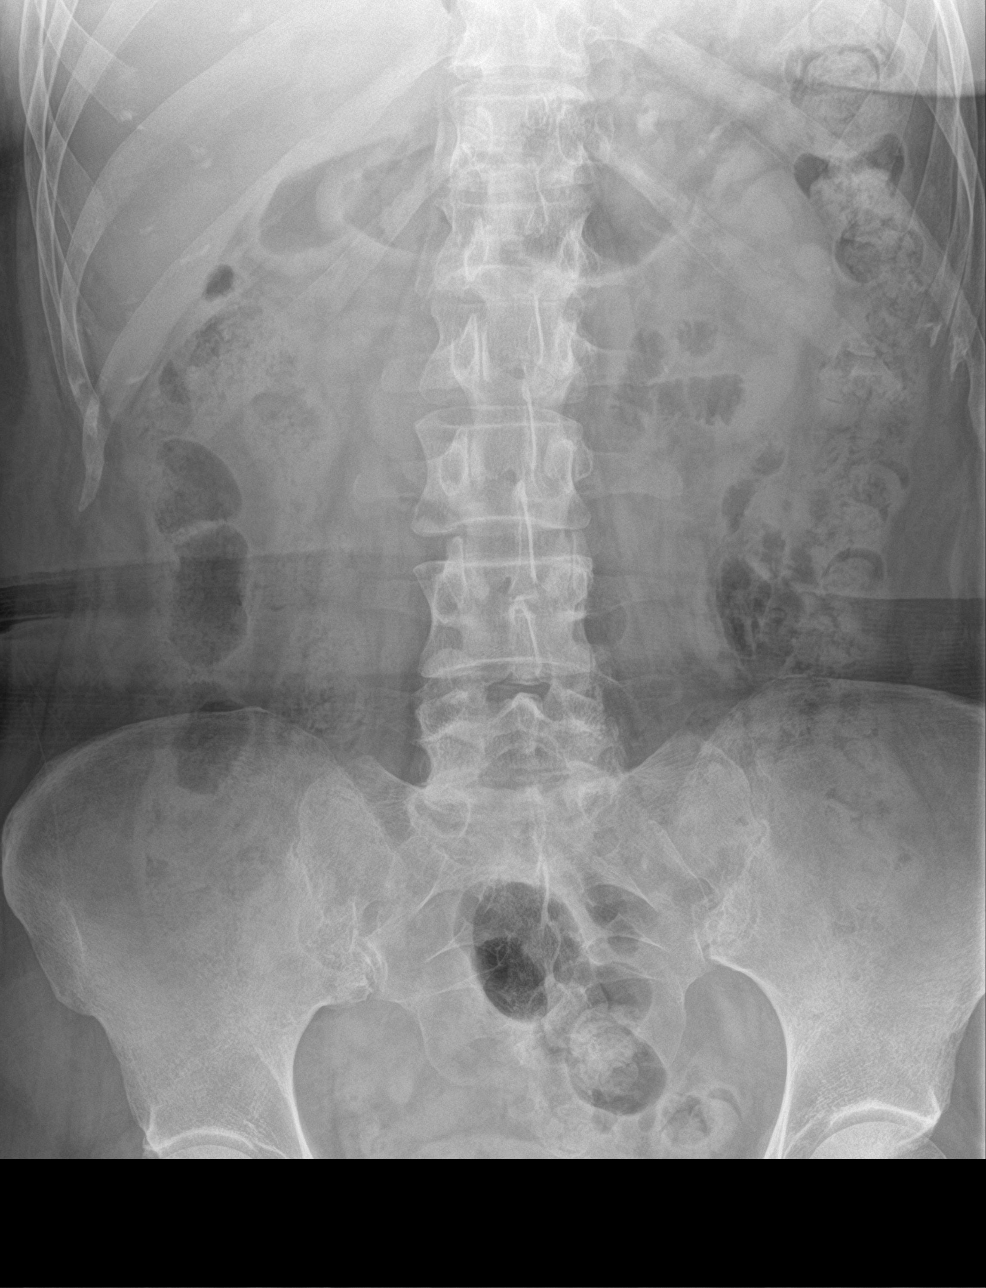

[abdomen supine (2 of 2)]
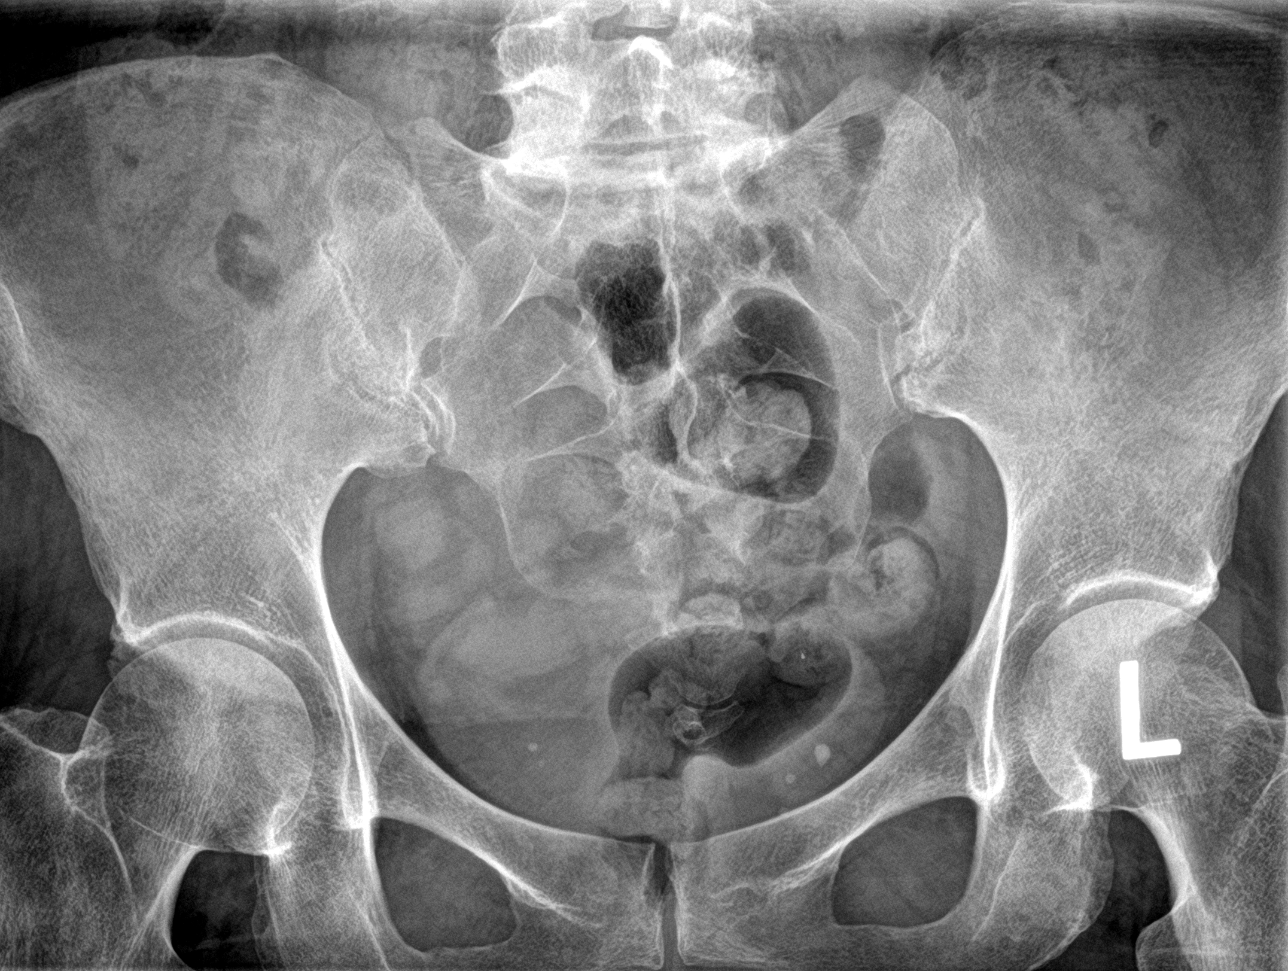

[3 of 3 positions shown; findings below may reference images not displayed]

FINDINGS: The bowel gas pattern is normal. There is no evidence of free air.
Phleboliths are noted in the pelvis. Large amount of stool seen
throughout the colon.
IMPRESSION: Large stool burden. No evidence of bowel obstruction or ileus.

## 2022-12-26 ENCOUNTER — Encounter (HOSPITAL_COMMUNITY): Payer: Self-pay | Admitting: Internal Medicine

## 2022-12-26 DIAGNOSIS — Z1231 Encounter for screening mammogram for malignant neoplasm of breast: Secondary | ICD-10-CM

## 2022-12-29 ENCOUNTER — Encounter: Payer: Self-pay | Admitting: *Deleted

## 2023-01-02 ENCOUNTER — Ambulatory Visit (HOSPITAL_COMMUNITY)
Admission: RE | Admit: 2023-01-02 | Discharge: 2023-01-02 | Disposition: A | Discharge status: Home / Self Care | Payer: MEDICAID | Source: Ambulatory Visit | Attending: Gerontology | Admitting: Gerontology

## 2023-01-02 ENCOUNTER — Other Ambulatory Visit (HOSPITAL_COMMUNITY): Payer: Self-pay | Admitting: Gerontology

## 2023-01-02 DIAGNOSIS — R059 Cough, unspecified: Secondary | ICD-10-CM | POA: Diagnosis not present

## 2023-01-02 DIAGNOSIS — M25561 Pain in right knee: Secondary | ICD-10-CM | POA: Diagnosis present

## 2023-07-03 ENCOUNTER — Encounter (INDEPENDENT_AMBULATORY_CARE_PROVIDER_SITE_OTHER): Payer: Self-pay | Admitting: *Deleted
